# Patient Record
Sex: Female | Born: 1965 | Hispanic: Yes | Marital: Married | Smoking: Former smoker
Health system: Southern US, Community
[De-identification: ages and names within clinical notes are randomized; demographics above are authoritative.]

## PROBLEM LIST (undated history)

## (undated) DIAGNOSIS — J45909 Unspecified asthma, uncomplicated: Secondary | ICD-10-CM

## (undated) DIAGNOSIS — K449 Diaphragmatic hernia without obstruction or gangrene: Secondary | ICD-10-CM

## (undated) DIAGNOSIS — K219 Gastro-esophageal reflux disease without esophagitis: Secondary | ICD-10-CM

## (undated) DIAGNOSIS — N809 Endometriosis, unspecified: Secondary | ICD-10-CM

## (undated) DIAGNOSIS — T7840XA Allergy, unspecified, initial encounter: Secondary | ICD-10-CM

## (undated) DIAGNOSIS — F419 Anxiety disorder, unspecified: Secondary | ICD-10-CM

## (undated) DIAGNOSIS — F32A Depression, unspecified: Secondary | ICD-10-CM

## (undated) DIAGNOSIS — F329 Major depressive disorder, single episode, unspecified: Secondary | ICD-10-CM

## (undated) DIAGNOSIS — M722 Plantar fascial fibromatosis: Secondary | ICD-10-CM

## (undated) HISTORY — PX: BREAST SURGERY: SHX581

## (undated) HISTORY — PX: DILATION AND CURETTAGE OF UTERUS: SHX78

## (undated) HISTORY — PX: FOOT SURGERY: SHX648

## (undated) HISTORY — PX: KNEE SURGERY: SHX244

## (undated) HISTORY — DX: Endometriosis, unspecified: N80.9

## (undated) HISTORY — PX: CHOLECYSTECTOMY: SHX55

## (undated) HISTORY — PX: OOPHORECTOMY: SHX86

## (undated) HISTORY — DX: Diaphragmatic hernia without obstruction or gangrene: K44.9

## (undated) HISTORY — DX: Anxiety disorder, unspecified: F41.9

## (undated) HISTORY — DX: Gastro-esophageal reflux disease without esophagitis: K21.9

## (undated) HISTORY — DX: Unspecified asthma, uncomplicated: J45.909

## (undated) HISTORY — DX: Depression, unspecified: F32.A

## (undated) HISTORY — DX: Allergy, unspecified, initial encounter: T78.40XA

## (undated) HISTORY — DX: Plantar fascial fibromatosis: M72.2

## (undated) HISTORY — DX: Major depressive disorder, single episode, unspecified: F32.9

## (undated) HISTORY — PX: ABDOMINAL HYSTERECTOMY: SHX81

---

## 2002-04-10 HISTORY — PX: BREAST EXCISIONAL BIOPSY: SUR124

## 2008-07-02 ENCOUNTER — Observation Stay: Payer: Self-pay | Admitting: Vascular Surgery

## 2008-10-21 ENCOUNTER — Ambulatory Visit: Payer: Self-pay | Admitting: Orthopedic Surgery

## 2008-10-22 ENCOUNTER — Ambulatory Visit: Payer: Self-pay | Admitting: Orthopedic Surgery

## 2009-03-09 ENCOUNTER — Observation Stay: Payer: Self-pay | Admitting: Internal Medicine

## 2010-03-11 ENCOUNTER — Ambulatory Visit: Payer: Self-pay | Admitting: Family Medicine

## 2011-06-20 ENCOUNTER — Ambulatory Visit: Payer: Self-pay

## 2012-04-10 HISTORY — PX: HERNIA REPAIR: SHX51

## 2012-06-12 ENCOUNTER — Emergency Department: Payer: Self-pay | Admitting: Emergency Medicine

## 2012-07-30 ENCOUNTER — Emergency Department: Payer: Self-pay | Admitting: Emergency Medicine

## 2012-07-30 LAB — CK TOTAL AND CKMB (NOT AT ARMC): CK, Total: 115 U/L (ref 21–215)

## 2012-07-30 LAB — CBC
HCT: 40.9 % (ref 35.0–47.0)
HGB: 13.8 g/dL (ref 12.0–16.0)
MCHC: 33.6 g/dL (ref 32.0–36.0)
MCV: 88 fL (ref 80–100)
RDW: 13.5 % (ref 11.5–14.5)
WBC: 6.3 10*3/uL (ref 3.6–11.0)

## 2012-07-30 LAB — TROPONIN I: Troponin-I: <0.02 ng/mL

## 2012-07-30 LAB — BASIC METABOLIC PANEL
BUN: 14 mg/dL (ref 7–18)
Calcium, Total: 8.3 mg/dL — ABNORMAL LOW (ref 8.5–10.1)
EGFR (Non-African Amer.): >60
Osmolality: 274 (ref 275–301)
Sodium: 137 mmol/L (ref 136–145)

## 2012-07-31 ENCOUNTER — Encounter: Payer: Self-pay | Admitting: *Deleted

## 2012-08-01 ENCOUNTER — Ambulatory Visit (INDEPENDENT_AMBULATORY_CARE_PROVIDER_SITE_OTHER): Payer: BC Managed Care – PPO | Admitting: Cardiovascular Disease

## 2012-08-01 ENCOUNTER — Encounter: Payer: Self-pay | Admitting: Cardiovascular Disease

## 2012-08-01 VITALS — BP 120/80 | HR 80 | Ht 62.0 in | Wt 226.0 lb

## 2012-08-01 DIAGNOSIS — R0602 Shortness of breath: Secondary | ICD-10-CM

## 2012-08-01 DIAGNOSIS — R079 Chest pain, unspecified: Secondary | ICD-10-CM

## 2012-08-01 NOTE — Patient Instructions (Addendum)
Labs today.  Please take orders to [facility] physician is ordering a Stress Myoview to be done at [facility].  We will call you with details.  Your physician has requested that you have an echocardiogram. Echocardiography is a painless test that uses sound waves to create images of your heart. It provides your doctor with information about the size and shape of your heart and how well your heart's chambers and valves are working. This procedure takes approximately one hour. There are no restrictions for this procedure.  Follow up after test.

## 2012-08-02 LAB — HEPATIC FUNCTION PANEL
Albumin: 4.6 g/dL (ref 3.5–5.5)
Bilirubin, Direct: 0.09 mg/dL (ref 0.00–0.40)
Total Bilirubin: 0.3 mg/dL (ref 0.0–1.2)
Total Protein: 7.1 g/dL (ref 6.0–8.5)

## 2012-08-02 LAB — URINALYSIS
Bilirubin, UA: NEGATIVE
Ketones, UA: NEGATIVE
Leukocytes, UA: NEGATIVE
Nitrite, UA: NEGATIVE
RBC, UA: NEGATIVE
Specific Gravity, UA: 1.021 (ref 1.005–1.030)
Urobilinogen, Ur: 0.2 mg/dL (ref 0.0–1.9)
pH, UA: 6 (ref 5.0–7.5)

## 2012-08-02 LAB — LIPID PANEL
Chol/HDL Ratio: 3.9 ratio units (ref 0.0–4.4)
HDL: 63 mg/dL (ref >39)
LDL Calculated: 159 mg/dL — ABNORMAL HIGH (ref 0–99)
VLDL Cholesterol Cal: 21 mg/dL (ref 5–40)

## 2012-08-04 ENCOUNTER — Encounter: Payer: Self-pay | Admitting: Cardiovascular Disease

## 2012-08-04 DIAGNOSIS — R0602 Shortness of breath: Secondary | ICD-10-CM | POA: Insufficient documentation

## 2012-08-04 DIAGNOSIS — R079 Chest pain, unspecified: Secondary | ICD-10-CM | POA: Insufficient documentation

## 2012-08-04 NOTE — Assessment & Plan Note (Signed)
Her chest pain is overall atypical. However, she has strong family history of premature coronary artery disease. Thus, I recommend further evaluation with a treadmill nuclear stress test.

## 2012-08-04 NOTE — Progress Notes (Signed)
HPI  This is a 47 year old female who was referred from the emergency room at [facility] for evaluation of chest pain and dyspnea. The patient has no previous cardiac history. However, there is strong family history of premature coronary artery disease. She is a former smoker and quit 2 years ago. She smoked for 15 years. She currently does not have a primary care physician and is not aware of any diabetes, hypertension or hyperlipidemia. She has not been feeling well for about a month and reports about 25 weight gain over the last month with increased dyspnea, edema and abdominal distention. This has been associated with palpitations as well. Recently, she started having substernal chest discomfort described as aching and heaviness which worsens when she lies down. It does not get worse with deep breath or cough. She felt that this was similar to a previous chest pain that she had when she was diagnosed with pleurisy. She has been taking ibuprofen as needed with no significant improvement. She went to the emergency room at [facility] on  April 22. Her labs were unremarkable including negative cardiac enzymes. ECG did not show any acute changes. Chest x-ray showed no acute cardiopulmonary disease.  Allergies  Allergen Reactions  . Butrans (Buprenorphine)   . Codeine   . Demerol (Meperidine)   . Fentanyl   . Latex   . Morphine And Related   . Naproxen   . Percocet (Oxycodone-Acetaminophen)   . Tramadol   . Vicodin (Hydrocodone-Acetaminophen)      No current outpatient prescriptions on file prior to visit.   No current facility-administered medications on file prior to visit.     Past Medical History  Diagnosis Date  . Asthma   . GERD (gastroesophageal reflux disease)   . Anxiety and depression   . Hiatal hernia      Past Surgical History  Procedure Laterality Date  . Cholecystectomy    . Abdominal hysterectomy    . Knee surgery       Family History  Problem Relation Age of Onset    . Heart attack Mother   . Heart disease Father      History   Social History  . Marital Status: Married    Spouse Name: N/A    Number of Children: N/A  . Years of Education: N/A   Occupational History  . Not on file.   Social History Main Topics  . Smoking status: Former Smoker -- 0.50 packs/day for 15 years  . Smokeless tobacco: Not on file  . Alcohol Use: Not on file  . Drug Use: Not on file  . Sexually Active: Not on file   Other Topics Concern  . Not on file   Social History Narrative  . No narrative on file     ROS Constitutional: Negative for fever, chills, diaphoresis, activity change, appetite change. HENT: Negative for hearing loss, nosebleeds, congestion, sore throat, facial swelling, drooling, trouble swallowing, neck pain, voice change, sinus pressure and tinnitus.  Eyes: Negative for photophobia, pain, discharge and visual disturbance.  Respiratory: Negative for apnea, cough, and wheezing.  Gastrointestinal: Negative for nausea, vomiting, abdominal pain, diarrhea, constipation, blood in stool and abdominal distention.  Genitourinary: Negative for dysuria, urgency, frequency, hematuria and decreased urine volume.  Musculoskeletal: Negative for myalgias, back pain, joint swelling, arthralgias and gait problem.  Skin: Negative for color change, pallor, rash and wound.  Neurological: Negative for dizziness, tremors, seizures, syncope, speech difficulty, weakness, light-headedness, numbness and headaches.  Psychiatric/Behavioral: Negative for  suicidal ideas, hallucinations, behavioral problems and agitation. The patient is not nervous/anxious.     PHYSICAL EXAM   BP 120/80  Pulse 80  Ht 5\' 2"  (1.575 m)  Wt 226 lb (102.513 kg)  BMI 41.33 kg/m2 Constitutional: She is oriented to person, place, and time. She appears well-developed and well-nourished. No distress.  HENT: No nasal discharge.  Head: Normocephalic and atraumatic.  Eyes: Pupils are equal and  round. Right eye exhibits no discharge. Left eye exhibits no discharge.  Neck: Normal range of motion. Neck supple. No JVD present. No thyromegaly present.  Cardiovascular: Normal rate, regular rhythm, normal heart sounds. Exam reveals no gallop and no friction rub. No murmur heard.  Pulmonary/Chest: Effort normal and breath sounds normal. No stridor. No respiratory distress. She has no wheezes. She has no rales. She exhibits no tenderness.  Abdominal: Soft. Bowel sounds are normal. She exhibits no distension. There is no tenderness. There is no rebound and no guarding.  Musculoskeletal: Normal range of motion. She exhibits no edema and no tenderness.  Neurological: She is alert and oriented to person, place, and time. Coordination normal.  Skin: Skin is warm and dry. No rash noted. She is not diaphoretic. No erythema. No pallor.  Psychiatric: She has a normal mood and affect. Her behavior is normal. Judgment and thought content normal.     [id number]  Rhythm  Low voltage in precordial leads.   ABNORMAL    ASSESSMENT AND PLAN

## 2012-08-04 NOTE — Assessment & Plan Note (Signed)
She has been experiencing progressive exertional dyspnea associated with lower extremity edema, abdominal distention and weight gain. I don't see clear evidence of fluid overload by physical exam. I will check routine labs to evaluate for other possible causes of weight gain and fluid retention. Thus, I will check TSH, liver function tests and also perform urinalysis to check for proteinuria. I will obtain an echocardiogram to evaluate LV systolic function, diastolic function and pulmonary pressure.

## 2012-08-05 ENCOUNTER — Encounter: Payer: Self-pay | Admitting: Cardiovascular Disease

## 2012-08-05 ENCOUNTER — Ambulatory Visit: Payer: Self-pay | Admitting: Cardiovascular Disease

## 2012-08-05 DIAGNOSIS — R079 Chest pain, unspecified: Secondary | ICD-10-CM

## 2012-08-05 NOTE — Progress Notes (Signed)
Pt informed of normal stress test results.

## 2012-08-09 ENCOUNTER — Ambulatory Visit (INDEPENDENT_AMBULATORY_CARE_PROVIDER_SITE_OTHER): Payer: BC Managed Care – PPO

## 2012-08-09 ENCOUNTER — Other Ambulatory Visit: Payer: Self-pay

## 2012-08-09 ENCOUNTER — Other Ambulatory Visit (INDEPENDENT_AMBULATORY_CARE_PROVIDER_SITE_OTHER): Payer: BC Managed Care – PPO

## 2012-08-09 DIAGNOSIS — R0602 Shortness of breath: Secondary | ICD-10-CM

## 2012-08-09 DIAGNOSIS — R7989 Other specified abnormal findings of blood chemistry: Secondary | ICD-10-CM

## 2012-08-09 DIAGNOSIS — R079 Chest pain, unspecified: Secondary | ICD-10-CM

## 2012-08-10 LAB — T4, FREE: Free T4: 1.04 ng/dL (ref 0.82–1.77)

## 2012-08-15 ENCOUNTER — Other Ambulatory Visit: Payer: BC Managed Care – PPO

## 2012-08-15 ENCOUNTER — Ambulatory Visit (INDEPENDENT_AMBULATORY_CARE_PROVIDER_SITE_OTHER): Payer: BC Managed Care – PPO | Admitting: Adult Health

## 2012-08-15 ENCOUNTER — Encounter: Payer: Self-pay | Admitting: Adult Health

## 2012-08-15 VITALS — BP 112/68 | HR 93 | Temp 98.1°F | Resp 14 | Ht 61.5 in | Wt 224.0 lb

## 2012-08-15 DIAGNOSIS — K219 Gastro-esophageal reflux disease without esophagitis: Secondary | ICD-10-CM | POA: Insufficient documentation

## 2012-08-15 DIAGNOSIS — Z0000 Encounter for general adult medical examination without abnormal findings: Secondary | ICD-10-CM | POA: Insufficient documentation

## 2012-08-15 DIAGNOSIS — R899 Unspecified abnormal finding in specimens from other organs, systems and tissues: Secondary | ICD-10-CM

## 2012-08-15 DIAGNOSIS — F988 Other specified behavioral and emotional disorders with onset usually occurring in childhood and adolescence: Secondary | ICD-10-CM | POA: Insufficient documentation

## 2012-08-15 DIAGNOSIS — Z1239 Encounter for other screening for malignant neoplasm of breast: Secondary | ICD-10-CM

## 2012-08-15 MED ORDER — EPINEPHRINE 0.3 MG/0.3ML IJ SOAJ: 0.3000 mg | Freq: Once | INTRAMUSCULAR | Status: DC

## 2012-08-15 MED ORDER — ESOMEPRAZOLE MAGNESIUM 40 MG PO CPDR: 40.0000 mg | DELAYED_RELEASE_CAPSULE | Freq: Every day | ORAL | Status: DC

## 2012-08-15 NOTE — Assessment & Plan Note (Signed)
Currently on Nexium 20 mg daily and not well controlled. Increase dose to 40 mg daily.

## 2012-08-15 NOTE — Assessment & Plan Note (Addendum)
Patient with long history of attention deficit disorder symptoms without treatment or diagnosis. Refer to psychology for testing.

## 2012-08-15 NOTE — Assessment & Plan Note (Signed)
Normal physical exam. She had some abnormal labs during hospitalization. TSH recently elevated. Repeat TSH. Patient reports that she was nonfasting when she had the lipid profile. Repeat lipids while fasting.

## 2012-08-15 NOTE — Progress Notes (Signed)
Subjective:    Patient ID: Adriana Moore, female    DOB: 10-Sep-1965, 47 y.o.   MRN: [id number]  HPI  Patient is a 47 y/o female who presents to clinic to establish care. She has not had a PCP in years. She was recently hospitalized with chest pain and is status post cardiac workup that was negative.   Past Medical History  Diagnosis Date  . Asthma   . GERD (gastroesophageal reflux disease)   . Anxiety and depression   . Hiatal hernia     Past Surgical History  Procedure Laterality Date  . Cholecystectomy    . Abdominal hysterectomy    . Knee surgery       Family History  Problem Relation Age of Onset  . Heart attack Mother   . Cancer Mother 53    Breast Cancer  . Diabetes Mother   . Heart disease Father   . Hyperlipidemia Sister   . Cancer Brother     renal cell cancer  . Heart disease Brother   . Cancer Maternal Grandmother     Breast cancer  . Heart disease Maternal Grandfather     CHF  . Heart disease Paternal Grandfather      History   Social History  . Marital Status: Married    Spouse Name: N/A    Number of Children: N/A  . Years of Education: N/A   Occupational History  . Not on file.   Social History Main Topics  . Smoking status: Former Smoker -- 0.50 packs/day for 15 years  . Smokeless tobacco: Never Used  . Alcohol Use: No     Comment: rare use  . Drug Use: No  . Sexually Active: Yes    Birth Control/ Protection: Surgical     Comment: hysterectomy   Other Topics Concern  . Not on file   Social History Narrative  . No narrative on file    Health Maintenance:  Tdap - 2013  Flu shot - Does not get flu shots  PAP - Total Hysterectomy with left oophorectomy   Mammography - Due - will order  Colonoscopy - N/A  Labs - Recent labs at [facility]  Depression Screen - patient without any s/s of sadness, hopelessness or anhedonia.  Tobacco Use - Remote tobacco quit 2 years ago. Smoked about 1 pack a week.  Dental Exams - 2 years ago  d/t no dental insurance.  Vision Exam - 1 month ago.   Exercise - Aerobic exercise daily.  Diet - Uses tropical oils and olive oil.   Review of Systems  Constitutional: Negative for fever, chills and fatigue.  Eyes: Negative.   Respiratory: Negative for cough, shortness of breath and wheezing.   Cardiovascular: Positive for leg swelling.       Occasional flutter feeling  Gastrointestinal: Positive for nausea. Negative for vomiting, abdominal pain, diarrhea, constipation and blood in stool.  Endocrine: Positive for heat intolerance.  Genitourinary: Negative.   Musculoskeletal:       Knee pain  Skin: Negative.   Allergic/Immunologic: Positive for environmental allergies and food allergies.  Neurological: Positive for headaches. Negative for dizziness, seizures, speech difficulty, light-headedness and numbness.  Hematological: Negative.   Psychiatric/Behavioral: Positive for decreased concentration. Negative for suicidal ideas, behavioral problems, confusion, sleep disturbance and agitation. The patient is not nervous/anxious.     BP 112/68  Pulse 93  Temp(Src) 98.1 F (36.7 C) (Oral)  Resp 14  Ht 5' 1.5" (1.562 m)  Wt 224 lb (101.606  kg)  BMI 41.64 kg/m2  SpO2 98%    Objective:   Physical Exam  Constitutional: She is oriented to person, place, and time.  Overweight female in no apparent distress  HENT:  Head: Normocephalic and atraumatic.  Right Ear: External ear normal.  Left Ear: External ear normal.  Nose: Nose normal.  Mouth/Throat: Oropharynx is clear and moist.  Eyes: Conjunctivae and EOM are normal. Pupils are equal, round, and reactive to light.  Neck: Normal range of motion. No thyromegaly present.  Cardiovascular: Normal rate, regular rhythm, normal heart sounds and intact distal pulses.  Exam reveals no gallop and no friction rub.   No murmur heard. Pulmonary/Chest: Effort normal and breath sounds normal. No respiratory distress. She has no wheezes. She has  no rales. She exhibits no tenderness.  Abdominal: Soft. Bowel sounds are normal. She exhibits no distension and no mass. There is tenderness. There is no rebound and no guarding.  Tenderness over epigastric area when palpated.  Musculoskeletal: Normal range of motion. She exhibits no edema and no tenderness.  Lymphadenopathy:    She has no cervical adenopathy.  Neurological: She is alert and oriented to person, place, and time. She has normal reflexes. No cranial nerve deficit. Coordination normal.  Skin: Skin is warm and dry. No rash noted. No erythema. No pallor.  Psychiatric: She has a normal mood and affect. Her behavior is normal. Judgment and thought content normal.          Assessment & Plan:

## 2012-08-15 NOTE — Assessment & Plan Note (Signed)
Schedule mammography

## 2012-08-15 NOTE — Patient Instructions (Addendum)
  Thank you for choosing [facility] for your healthcare needs.  Please return for your fasting labs.  We will contact you once the results are available.  I am referring you to psychology for evaluation and testing of ADD.  I have ordered Nexium 40 mg daily.  Our office will contact you with appointment for your annual mammography.

## 2012-08-19 ENCOUNTER — Other Ambulatory Visit (INDEPENDENT_AMBULATORY_CARE_PROVIDER_SITE_OTHER): Payer: BC Managed Care – PPO

## 2012-08-19 ENCOUNTER — Telehealth: Payer: Self-pay | Admitting: *Deleted

## 2012-08-19 DIAGNOSIS — R6889 Other general symptoms and signs: Secondary | ICD-10-CM

## 2012-08-19 DIAGNOSIS — R899 Unspecified abnormal finding in specimens from other organs, systems and tissues: Secondary | ICD-10-CM

## 2012-08-19 LAB — LDL CHOLESTEROL, DIRECT: Direct LDL: 108.2 mg/dL

## 2012-08-19 LAB — LIPID PANEL
Cholesterol: 183 mg/dL (ref 0–200)
HDL: 57.8 mg/dL (ref >39.00)

## 2012-08-19 LAB — TSH: TSH: 2.56 u[IU]/mL (ref 0.35–5.50)

## 2012-08-19 NOTE — Telephone Encounter (Signed)
Pt came in for labs work today and asked if she can get this medications filled  Singulair 10 mg tab  ProAir HFA 90 mcg

## 2012-08-20 MED ORDER — ALBUTEROL SULFATE HFA 108 (90 BASE) MCG/ACT IN AERS: 2.0000 | INHALATION_SPRAY | Freq: Four times a day (QID) | RESPIRATORY_TRACT | Status: DC | PRN

## 2012-08-20 MED ORDER — MONTELUKAST SODIUM 10 MG PO TABS: 10.0000 mg | ORAL_TABLET | Freq: Every day | ORAL | Status: DC

## 2012-08-20 NOTE — Telephone Encounter (Signed)
Rx sent to pharmacy by escript  

## 2012-08-20 NOTE — Telephone Encounter (Signed)
Read  Below.

## 2012-08-20 NOTE — Telephone Encounter (Signed)
OK to refill medications: Singulair and ProAir

## 2012-08-22 ENCOUNTER — Telehealth: Payer: Self-pay | Admitting: *Deleted

## 2012-08-22 ENCOUNTER — Other Ambulatory Visit: Payer: Self-pay | Admitting: Adult Health

## 2012-08-22 DIAGNOSIS — K219 Gastro-esophageal reflux disease without esophagitis: Secondary | ICD-10-CM

## 2012-08-22 NOTE — Telephone Encounter (Signed)
Pt asked about a referral for a mammogram, had not been called with an appointment. Advised that she may call [facility], telephone number provided, to schedule her screening mammogram at her convenience. Verbalized understanding.

## 2012-08-26 ENCOUNTER — Ambulatory Visit: Payer: BC Managed Care – PPO | Admitting: Cardiovascular Disease

## 2012-09-23 ENCOUNTER — Ambulatory Visit: Payer: Self-pay | Admitting: Internal Medicine

## 2012-10-24 ENCOUNTER — Encounter: Payer: Self-pay | Admitting: Emergency Medicine

## 2012-10-25 ENCOUNTER — Encounter: Payer: Self-pay | Admitting: Internal Medicine

## 2012-11-04 ENCOUNTER — Other Ambulatory Visit: Payer: Self-pay | Admitting: Bariatrics

## 2012-11-04 DIAGNOSIS — K219 Gastro-esophageal reflux disease without esophagitis: Secondary | ICD-10-CM

## 2012-11-05 ENCOUNTER — Other Ambulatory Visit: Payer: Self-pay | Admitting: Bariatrics

## 2012-11-05 DIAGNOSIS — K449 Diaphragmatic hernia without obstruction or gangrene: Secondary | ICD-10-CM

## 2012-11-06 ENCOUNTER — Ambulatory Visit
Admission: RE | Admit: 2012-11-06 | Discharge: 2012-11-06 | Disposition: A | Discharge status: Home / Self Care | Payer: BC Managed Care – PPO | Source: Ambulatory Visit | Attending: Bariatrics | Admitting: Bariatrics

## 2012-11-06 ENCOUNTER — Other Ambulatory Visit: Payer: Self-pay

## 2012-11-06 DIAGNOSIS — K219 Gastro-esophageal reflux disease without esophagitis: Secondary | ICD-10-CM

## 2012-12-10 ENCOUNTER — Ambulatory Visit: Payer: Self-pay | Admitting: Specialist

## 2012-12-18 ENCOUNTER — Other Ambulatory Visit: Payer: Self-pay | Admitting: *Deleted

## 2012-12-18 ENCOUNTER — Telehealth: Payer: Self-pay | Admitting: Adult Health

## 2012-12-18 NOTE — Telephone Encounter (Signed)
Pt dropped off forms to be filled out for Bariatric center. Placed in inbox up front.

## 2012-12-19 NOTE — Telephone Encounter (Signed)
Spoke with pt, states need form filled out referring to May office visit. Form given to Raquel. Pt states will complete Food choices and exercise.

## 2012-12-19 NOTE — Telephone Encounter (Signed)
Left message for pt to return my call regarding form.

## 2012-12-20 NOTE — Telephone Encounter (Signed)
Thank you for clarifying that to patient. They need to be told that forms take anywhere from 7-14 days. We will notify her when ready

## 2012-12-20 NOTE — Telephone Encounter (Signed)
Pt called, checking on status of form, requesting it to be picked up by Monday. Advised pt it was not yet completed, to please allow a few days for forms to be completed, that it was just dropped off 9/10 and that I would call her when it was ready for pickup.

## 2012-12-23 ENCOUNTER — Telehealth: Payer: Self-pay | Admitting: Adult Health

## 2012-12-23 NOTE — Telephone Encounter (Signed)
Error

## 2012-12-24 ENCOUNTER — Telehealth: Payer: Self-pay | Admitting: *Deleted

## 2012-12-24 NOTE — Telephone Encounter (Signed)
See other telephone note regarding completion of form.

## 2012-12-24 NOTE — Telephone Encounter (Signed)
Message copied by Dema Severin on Tue Dec 24, 2012  8:27 AM ------      Message from: Novella Olive      Created: Mon Dec 23, 2012 10:48 PM      Regarding: Medically Supervised Weight Loss Documentation Progress Note for Bariatrics       I was working on completing the supervised weight loss documentation patient requested. I have only seen patient once (May) to establish care. We did not discuss how many calories she was eating or should be eating and I do not feel comfortable filling in the bottom portion of the form. I cannot state whether she has been compliant or not because I haven't seen her.             I will be glad to see her and we can establish a plan but this was not done in May. Sorry but I cannot fill out the form. If you like speak to me prior to notifying the patient.            Raquel ------

## 2012-12-24 NOTE — Telephone Encounter (Signed)
The visit was to establish care and a lot was discussed pertaining to her hx, family hx etc. She was using tropical oils and I advised her against it since the new guidelines call to avoid. We did not go into details about foods she should eat or calories per day. The form states that this was not addressed.

## 2012-12-24 NOTE — Telephone Encounter (Signed)
I called and spoke with patient to advise her that Raquel couldn't fill out all of the form since it wasn't discussed at her initial visit.  Patient was calm when I was talking with her and asked that Raquel fill out the portions of the form she could.  She would like to pick up form in the morning in order to take with her to the Bariatric clinic tomorrow. I advised patient that if it wasn't enough information for the Bariatric clinic that she would need to set up another appointment to discuss the areas that are blank, she agreed if it wasn't enough information she would come in for another appointment.

## 2012-12-24 NOTE — Telephone Encounter (Signed)
Pt called again and left voicemail, stating she is really bothered that form cannot be completed. States weight loss was discussed at first visit, she was given papers on foods to avoid. States a lot of time was spent on it at May visit. Is upset that all this was not included in her visit note.

## 2012-12-24 NOTE — Telephone Encounter (Signed)
Pt states that calories and foods to avoid were discussed at May appointment. She would like as much of the form completed as possible, wants to pick up tomorrow. Declined an appointment to discuss further.

## 2013-01-08 ENCOUNTER — Ambulatory Visit: Payer: Self-pay | Admitting: Specialist

## 2013-01-16 ENCOUNTER — Ambulatory Visit (INDEPENDENT_AMBULATORY_CARE_PROVIDER_SITE_OTHER): Payer: BC Managed Care – PPO | Admitting: Adult Health

## 2013-01-16 ENCOUNTER — Encounter: Payer: Self-pay | Admitting: Adult Health

## 2013-01-16 VITALS — BP 116/78 | HR 85 | Temp 98.0°F | Resp 12 | Wt 222.5 lb

## 2013-01-16 DIAGNOSIS — E7800 Pure hypercholesterolemia, unspecified: Secondary | ICD-10-CM

## 2013-01-16 DIAGNOSIS — K219 Gastro-esophageal reflux disease without esophagitis: Secondary | ICD-10-CM

## 2013-01-16 NOTE — Patient Instructions (Signed)
  Please return at your earliest convenience for fasting labs.  I will have the form ready for you to pick up when you return.

## 2013-01-16 NOTE — Assessment & Plan Note (Addendum)
Recommended daily caloric intake 1200-1600. Patient is staying on the lower end of 1200 calories daily. Continue with lean protein choices, healthy fats such as olive oil and avocado. Patient reports that she is still using coconut oil. Once again discussed recommendations of the American Heart Association guidelines to avoid tropical oils. Recommendation based on new guidelines. Check lipids. She is exercising daily by walking 30 minutes a day for 7 days a week using handheld awaits. Her goal is to increase her exercise to walking 5 miles per day and adding [employer] training. Total weight loss since last visit is 13 pounds. Followup in one month.

## 2013-01-16 NOTE — Progress Notes (Signed)
Subjective:    Patient ID: Adriana Moore, female    DOB: Aug 24, 1965, 47 y.o.   MRN: [id number]  HPI  Patient is a 47 year old female who presents to clinic for supervised weight loss in anticipation of bariatric surgery in December 2014. She is being followed by Dr. Effie Shy at [facility] [place], [place] location. Patient's beginning weight and BMI are 235 lbs and 43.7 respectively. Today's weight is 222 lbs and BMI 41.37; her blood pressure is 116/78 with heart rate 85. Patient reports compliance with caloric intake and exercise regimen. She is currently consuming 1200 calories daily and using My Fitness Pal which is a smartphone app as a means to help stay on track. She reports exercising by walking briskly for 30 minutes 7 days a week. She reports being somewhat limited in exercise choices secondary to plantar fasciitis. Patient has recently introduced hand-held weights while walking. She presents to clinic today with her husband. I previously saw patient for the first time in May 2014 to establish care within our practice.    Past Medical History  Diagnosis Date  . Asthma   . GERD (gastroesophageal reflux disease)   . Anxiety and depression   . Hiatal hernia      Past Surgical History  Procedure Laterality Date  . Cholecystectomy    . Abdominal hysterectomy    . Knee surgery       Family History  Problem Relation Age of Onset  . Heart attack Mother   . Cancer Mother 14    Breast Cancer  . Diabetes Mother   . Heart disease Father   . Hyperlipidemia Sister   . Cancer Brother     renal cell cancer  . Heart disease Brother   . Cancer Maternal Grandmother     Breast cancer  . Heart disease Maternal Grandfather     CHF  . Heart disease Paternal Grandfather      History   Social History  . Marital Status: Married    Spouse Name: N/A    Number of Children: N/A  . Years of Education: N/A   Occupational History  . Not on file.   Social History  Main Topics  . Smoking status: Former Smoker -- 0.50 packs/day for 15 years  . Smokeless tobacco: Never Used  . Alcohol Use: No     Comment: rare use  . Drug Use: No  . Sexual Activity: Yes    Birth Control/ Protection: Surgical     Comment: hysterectomy   Other Topics Concern  . Not on file   Social History Narrative  . No narrative on file     Review of Systems  Constitutional: Negative.   HENT: Negative.   Respiratory: Negative.   Cardiovascular: Negative.   Gastrointestinal: Negative.        Recently had upper endoscopy with finding of hiatal hernia. Nexium increased to 40 mg bid.  Genitourinary: Negative.   Musculoskeletal: Negative for back pain, gait problem and joint swelling.       Hx of plantar fasciitis  Skin: Negative.   Allergic/Immunologic: Negative.   Neurological: Negative.   Hematological: Negative.   Psychiatric/Behavioral: Negative.        Objective:   Physical Exam  Constitutional: She is oriented to person, place, and time.  Overweight, 47 y/o female in NAD  Cardiovascular: Normal rate, regular rhythm and normal heart sounds.  Exam reveals no gallop and no friction rub.   No murmur heard. Pulmonary/Chest: Effort normal and  breath sounds normal. No respiratory distress. She has no wheezes. She has no rales.  Abdominal: Soft. Bowel sounds are normal.  Musculoskeletal: Normal range of motion.  Neurological: She is alert and oriented to person, place, and time.  Skin: Skin is warm and dry.  Psychiatric: She has a normal mood and affect. Her behavior is normal. Judgment and thought content normal.    BP 116/78  Pulse 85  Temp(Src) 98 F (36.7 C) (Oral)  Resp 12  Wt 222 lb 8 oz (100.925 kg)  BMI 41.37 kg/m2  SpO2 96%       Assessment & Plan:

## 2013-01-16 NOTE — Assessment & Plan Note (Signed)
Recent upper endoscopy by Dr. Alva Garnet with findings of hiatal hernia. Nexium increased to 40 mg twice a day. She is doing well on this dose.

## 2013-01-27 ENCOUNTER — Other Ambulatory Visit: Payer: BC Managed Care – PPO

## 2013-01-27 ENCOUNTER — Ambulatory Visit: Payer: BC Managed Care – PPO | Admitting: Adult Health

## 2013-01-29 ENCOUNTER — Ambulatory Visit: Payer: Self-pay | Admitting: Podiatry

## 2013-02-03 ENCOUNTER — Other Ambulatory Visit: Payer: BC Managed Care – PPO

## 2013-02-14 ENCOUNTER — Other Ambulatory Visit (INDEPENDENT_AMBULATORY_CARE_PROVIDER_SITE_OTHER): Payer: BC Managed Care – PPO

## 2013-02-14 DIAGNOSIS — E7800 Pure hypercholesterolemia, unspecified: Secondary | ICD-10-CM

## 2013-02-14 LAB — LIPID PANEL
Total CHOL/HDL Ratio: 4
VLDL: 9.2 mg/dL (ref 0.0–40.0)

## 2013-02-14 LAB — LDL CHOLESTEROL, DIRECT: Direct LDL: 160.4 mg/dL

## 2013-02-17 ENCOUNTER — Encounter: Payer: Self-pay | Admitting: *Deleted

## 2013-02-20 ENCOUNTER — Encounter: Payer: Self-pay | Admitting: Adult Health

## 2013-02-20 ENCOUNTER — Ambulatory Visit (INDEPENDENT_AMBULATORY_CARE_PROVIDER_SITE_OTHER): Payer: BC Managed Care – PPO | Admitting: Adult Health

## 2013-02-20 NOTE — Assessment & Plan Note (Signed)
Patient has been compliant with diet and exercise. She has introduced additional exercise to try to build a muscle. Concerned about not been able to do too many abdominal exercises secondary to hiatal hernia. Recommended that she introduce planks to her exercise regimen. These exercises engage the entire core thus strengthening her abdominal muscles. She has also met her goal from previous visit of increasing her fluid intake. She has been drinking 3 L of water daily. Patient remains hopeful that the surgery will be in December. She has an appt with the Bariatric Center this week to discuss further. Patient had lipids drawn this week. Show higher total cholesterol 227 and LDL 160.4. Her HDL is very good at 63.30. I suspect that these numbers will improve with her discontinuation of coconut oil. Recheck lipids in 3 months.

## 2013-02-20 NOTE — Progress Notes (Signed)
Pre visit review using our clinic review tool, if applicable. No additional management support is needed unless otherwise documented below in the visit note. 

## 2013-02-20 NOTE — Progress Notes (Signed)
Subjective:    Patient ID: Adriana Moore, female    DOB: 15-Nov-1965, 47 y.o.   MRN: [id number]  HPI   Patient is a 47 year old female who presents to clinic for supervised weight loss in anticipation of bariatric surgery in December 2014. She is being followed by Dr. Effie Shy at [facility], [place] location. Patient's beginning weight and BMI are 235 lbs and 43.7 respectively. Today's weight is 223 lbs and BMI 42.16; her blood pressure is 130/80 with heart rate 87. Patient reports compliance with caloric intake and exercise regimen. She is currently consuming 1200 calories daily and using My Fitness Pal which is a smartphone app as a means to help stay on track. She reports meeting previous visit's goal of introducing UFC type training activity. She also reports approximately 2 hours of exercise activity including walking, stationary bicycle and free weights. She reports doing this 7 times a week. During previous visit, a recommendation was made to avoid tropical goals in preparation of food and cooking. Patient had been using coconut oil. She has discontinued this since last visit.   Past Medical History  Diagnosis Date  . Asthma   . GERD (gastroesophageal reflux disease)   . Anxiety and depression   . Hiatal hernia      Past Surgical History  Procedure Laterality Date  . Cholecystectomy    . Abdominal hysterectomy    . Knee surgery       Family History  Problem Relation Age of Onset  . Heart attack Mother   . Cancer Mother 56    Breast Cancer  . Diabetes Mother   . Heart disease Father   . Hyperlipidemia Sister   . Cancer Brother     renal cell cancer  . Heart disease Brother   . Cancer Maternal Grandmother     Breast cancer  . Heart disease Maternal Grandfather     CHF  . Heart disease Paternal Grandfather      History   Social History  . Marital Status: Married    Spouse Name: N/A    Number of Children: N/A  . Years of Education:  N/A   Occupational History  . Not on file.   Social History Main Topics  . Smoking status: Former Smoker -- 0.50 packs/day for 15 years  . Smokeless tobacco: Never Used  . Alcohol Use: No     Comment: rare use  . Drug Use: No  . Sexual Activity: Yes    Birth Control/ Protection: Surgical     Comment: hysterectomy   Other Topics Concern  . Not on file   Social History Narrative  . No narrative on file     Review of Systems  Constitutional: Negative.   HENT: Negative.   Respiratory: Negative.   Cardiovascular: Negative.   Gastrointestinal: Negative.        Trying to find activity where she could engage abdominal muscles without aggravating hiatal hernia symptoms.  Genitourinary: Negative.   Musculoskeletal: Negative for back pain, gait problem and joint swelling.       Hx of plantar fasciitis  Skin: Negative.   Allergic/Immunologic: Negative.   Neurological: Negative.   Hematological: Negative.   Psychiatric/Behavioral: Negative.         Objective:   Physical Exam  Constitutional: She is oriented to person, place, and time.  Overweight, 47 y/o female in NAD  Cardiovascular: Normal rate, regular rhythm and normal heart sounds.  Exam reveals no gallop and no friction rub.  No murmur heard. Pulmonary/Chest: Effort normal and breath sounds normal. No respiratory distress. She has no wheezes. She has no rales.  Abdominal: Bowel sounds are normal.  Musculoskeletal: Normal range of motion.  Neurological: She is alert and oriented to person, place, and time.  Skin: Skin is warm and dry.  Psychiatric: She has a normal mood and affect. Her behavior is normal. Judgment and thought content normal.    BP 130/80  Pulse 87  Temp(Src) 97.7 F (36.5 C) (Oral)  Ht 5\' 1"  (1.549 m)  Wt 223 lb (101.152 kg)  BMI 42.16 kg/m2  SpO2 99%       Assessment & Plan:

## 2013-02-21 ENCOUNTER — Other Ambulatory Visit: Payer: Self-pay | Admitting: Specialist

## 2013-02-21 LAB — COMPREHENSIVE METABOLIC PANEL
Anion Gap: 5 — ABNORMAL LOW (ref 7–16)
BUN: 17 mg/dL (ref 7–18)
Bilirubin,Total: 0.4 mg/dL (ref 0.2–1.0)
Calcium, Total: 8.4 mg/dL — ABNORMAL LOW (ref 8.5–10.1)
Chloride: 105 mmol/L (ref 98–107)
Co2: 27 mmol/L (ref 21–32)
EGFR (African American): >60
EGFR (Non-African Amer.): >60
Glucose: 96 mg/dL (ref 65–99)
Potassium: 4.2 mmol/L (ref 3.5–5.1)
Total Protein: 7.2 g/dL (ref 6.4–8.2)

## 2013-02-21 LAB — CBC WITH DIFFERENTIAL/PLATELET
Basophil #: 0 10*3/uL (ref 0.0–0.1)
Basophil %: 0.7 %
Eosinophil %: 2.5 %
HCT: 38.7 % (ref 35.0–47.0)
HGB: 13.2 g/dL (ref 12.0–16.0)
MCH: 29.6 pg (ref 26.0–34.0)
MCHC: 34.2 g/dL (ref 32.0–36.0)
MCV: 87 fL (ref 80–100)
Neutrophil #: 3.6 10*3/uL (ref 1.4–6.5)
Platelet: 197 10*3/uL (ref 150–440)
RBC: 4.47 10*6/uL (ref 3.80–5.20)
RDW: 13.6 % (ref 11.5–14.5)
WBC: 6.3 10*3/uL (ref 3.6–11.0)

## 2013-02-21 LAB — IRON AND TIBC
Iron Saturation: 20 %
Iron: 73 ug/dL (ref 50–170)
Unbound Iron-Bind.Cap.: 285 ug/dL

## 2013-02-21 LAB — HEMOGLOBIN A1C: Hemoglobin A1C: 5.5 % (ref 4.2–6.3)

## 2013-02-21 LAB — PROTIME-INR
INR: 0.9
Prothrombin Time: 12.6 secs (ref 11.5–14.7)

## 2013-02-21 LAB — BILIRUBIN, DIRECT: Bilirubin, Direct: 0.1 mg/dL (ref 0.00–0.20)

## 2013-02-21 LAB — PHOSPHORUS: Phosphorus: 3 mg/dL (ref 2.5–4.9)

## 2013-02-21 LAB — FERRITIN: Ferritin (ARMC): 28 ng/mL (ref 8–388)

## 2013-03-10 ENCOUNTER — Encounter: Payer: Self-pay | Admitting: Adult Health

## 2013-03-10 ENCOUNTER — Ambulatory Visit (INDEPENDENT_AMBULATORY_CARE_PROVIDER_SITE_OTHER): Payer: BC Managed Care – PPO | Admitting: Adult Health

## 2013-03-10 NOTE — Progress Notes (Signed)
   Subjective:    Patient ID: Adriana Moore, female    DOB: 13-Mar-1966, 47 y.o.   MRN: [id number]  HPI  Patient is a 47 year old female who presents to clinic for supervised weight loss in anticipation of bariatric surgery in December 2014. This is her 6/6 visit preceding surgery. She is being followed by Dr. Effie Shy at [facility], [place] location. Patient's beginning weight and BMI are 235 lbs and 43.7 respectively. Today's weight is 217 lbs and BMI 41.0; her blood pressure is 134/80 with heart rate 86. Patient reports compliance with caloric intake and exercise regimen. She is currently consuming 1200 calories daily and using My Fitness Pal which is a smartphone app as a means to help stay on track. She has started the Pre-Surgery Liver Reduction diet which was provided by the [facility]. Adriana Moore reported during last visit having trouble with abdominal exercises 2/2 a hiatal hernia. We dicussed that exercises that increase abdominal pressure would aggravate her hiatal hernia. I recommended she introduce Planks for core and abdominal strengthening. She reports planks for 3 min intervals. She continues with UFC type training 2 times/week and exercises ~ 2 hours daily including walking, stationary bicycle and free weights. Today she is feeling slightly under the weather with upper respiratory symptoms which she is taking OTC medications for. Denies fever, chills.  Current Outpatient Prescriptions on File Prior to Visit  Medication Sig Dispense Refill  . albuterol (PROVENTIL HFA;VENTOLIN HFA) 108 (90 BASE) MCG/ACT inhaler Inhale 2 puffs into the lungs every 6 (six) hours as needed for wheezing.  1 Inhaler  1  . EPINEPHrine (EPI-PEN) 0.3 mg/0.3 mL DEVI Inject 0.3 mLs (0.3 mg total) into the muscle once.  1 Device  2  . esomeprazole (NEXIUM) 40 MG capsule Take 40 mg by mouth 2 (two) times daily.      . montelukast (SINGULAIR) 10 MG tablet Take 10 mg by mouth daily as  needed.       No current facility-administered medications on file prior to visit.    Review of Systems  Constitutional: Negative for fever and chills.  HENT: Positive for postnasal drip and rhinorrhea. Negative for sinus pressure and sore throat.   Respiratory: Positive for cough.   Cardiovascular: Negative.   Gastrointestinal: Negative.   Genitourinary: Negative.   Musculoskeletal: Negative.   Neurological: Negative.   Psychiatric/Behavioral: Negative.        Objective:   Physical Exam  Constitutional: She is oriented to person, place, and time.  Overweight, pleasant female in NAD  Cardiovascular: Normal rate, regular rhythm, normal heart sounds and intact distal pulses.  Exam reveals no gallop and no friction rub.   No murmur heard. Pulmonary/Chest: Effort normal and breath sounds normal. No respiratory distress. She has no wheezes. She has no rales.  Abdominal: Soft. Bowel sounds are normal.  Musculoskeletal: Normal range of motion.  Neurological: She is alert and oriented to person, place, and time.  Skin: Skin is warm and dry.  Psychiatric: She has a normal mood and affect. Her behavior is normal. Judgment and thought content normal.    BP 134/80  Pulse 80  Temp(Src) 97.6 F (36.4 C) (Oral)  Ht 5\' 1"  (1.549 m)  Wt 217 lb (98.431 kg)  BMI 41.02 kg/m2  SpO2 97%      Assessment & Plan:

## 2013-03-10 NOTE — Progress Notes (Signed)
Pre visit review using our clinic review tool, if applicable. No additional management support is needed unless otherwise documented below in the visit note. 

## 2013-03-10 NOTE — Assessment & Plan Note (Addendum)
Patient has lost 18 lbs since beginning supervised weight loss. She is motivated and is working hard towards her goals of having bariatric surgery. Continue to advocate that patient is a good candidate for the surgery and this would help prevent further disease such as HTN, diabetes. [facility] with [facility] to discuss surgery before the end of the year.

## 2013-03-20 ENCOUNTER — Ambulatory Visit: Payer: BC Managed Care – PPO | Admitting: Adult Health

## 2013-07-22 ENCOUNTER — Other Ambulatory Visit: Payer: Self-pay | Admitting: Bariatrics

## 2013-07-22 DIAGNOSIS — R12 Heartburn: Secondary | ICD-10-CM

## 2013-07-22 DIAGNOSIS — K449 Diaphragmatic hernia without obstruction or gangrene: Secondary | ICD-10-CM

## 2013-07-22 DIAGNOSIS — R1013 Epigastric pain: Secondary | ICD-10-CM

## 2013-07-24 ENCOUNTER — Ambulatory Visit
Admission: RE | Admit: 2013-07-24 | Discharge: 2013-07-24 | Disposition: A | Discharge status: Home / Self Care | Payer: BC Managed Care – PPO | Source: Ambulatory Visit | Attending: Bariatrics | Admitting: Bariatrics

## 2013-07-24 ENCOUNTER — Other Ambulatory Visit: Payer: Self-pay | Admitting: Bariatrics

## 2013-07-24 DIAGNOSIS — R1013 Epigastric pain: Secondary | ICD-10-CM

## 2013-07-24 DIAGNOSIS — K449 Diaphragmatic hernia without obstruction or gangrene: Secondary | ICD-10-CM

## 2013-07-24 DIAGNOSIS — R12 Heartburn: Secondary | ICD-10-CM

## 2013-07-25 ENCOUNTER — Other Ambulatory Visit: Payer: BC Managed Care – PPO

## 2013-12-30 IMAGING — CR DG CHEST 2V
1 series · 2 of 2 positions shown · non-contrast
Comparison: none

REASON FOR EXAM: chest pain
COMMENTS:

[Series 1: w chest pa · 0.14mm/px · 2 of 2 slices shown]
[im 1/2]
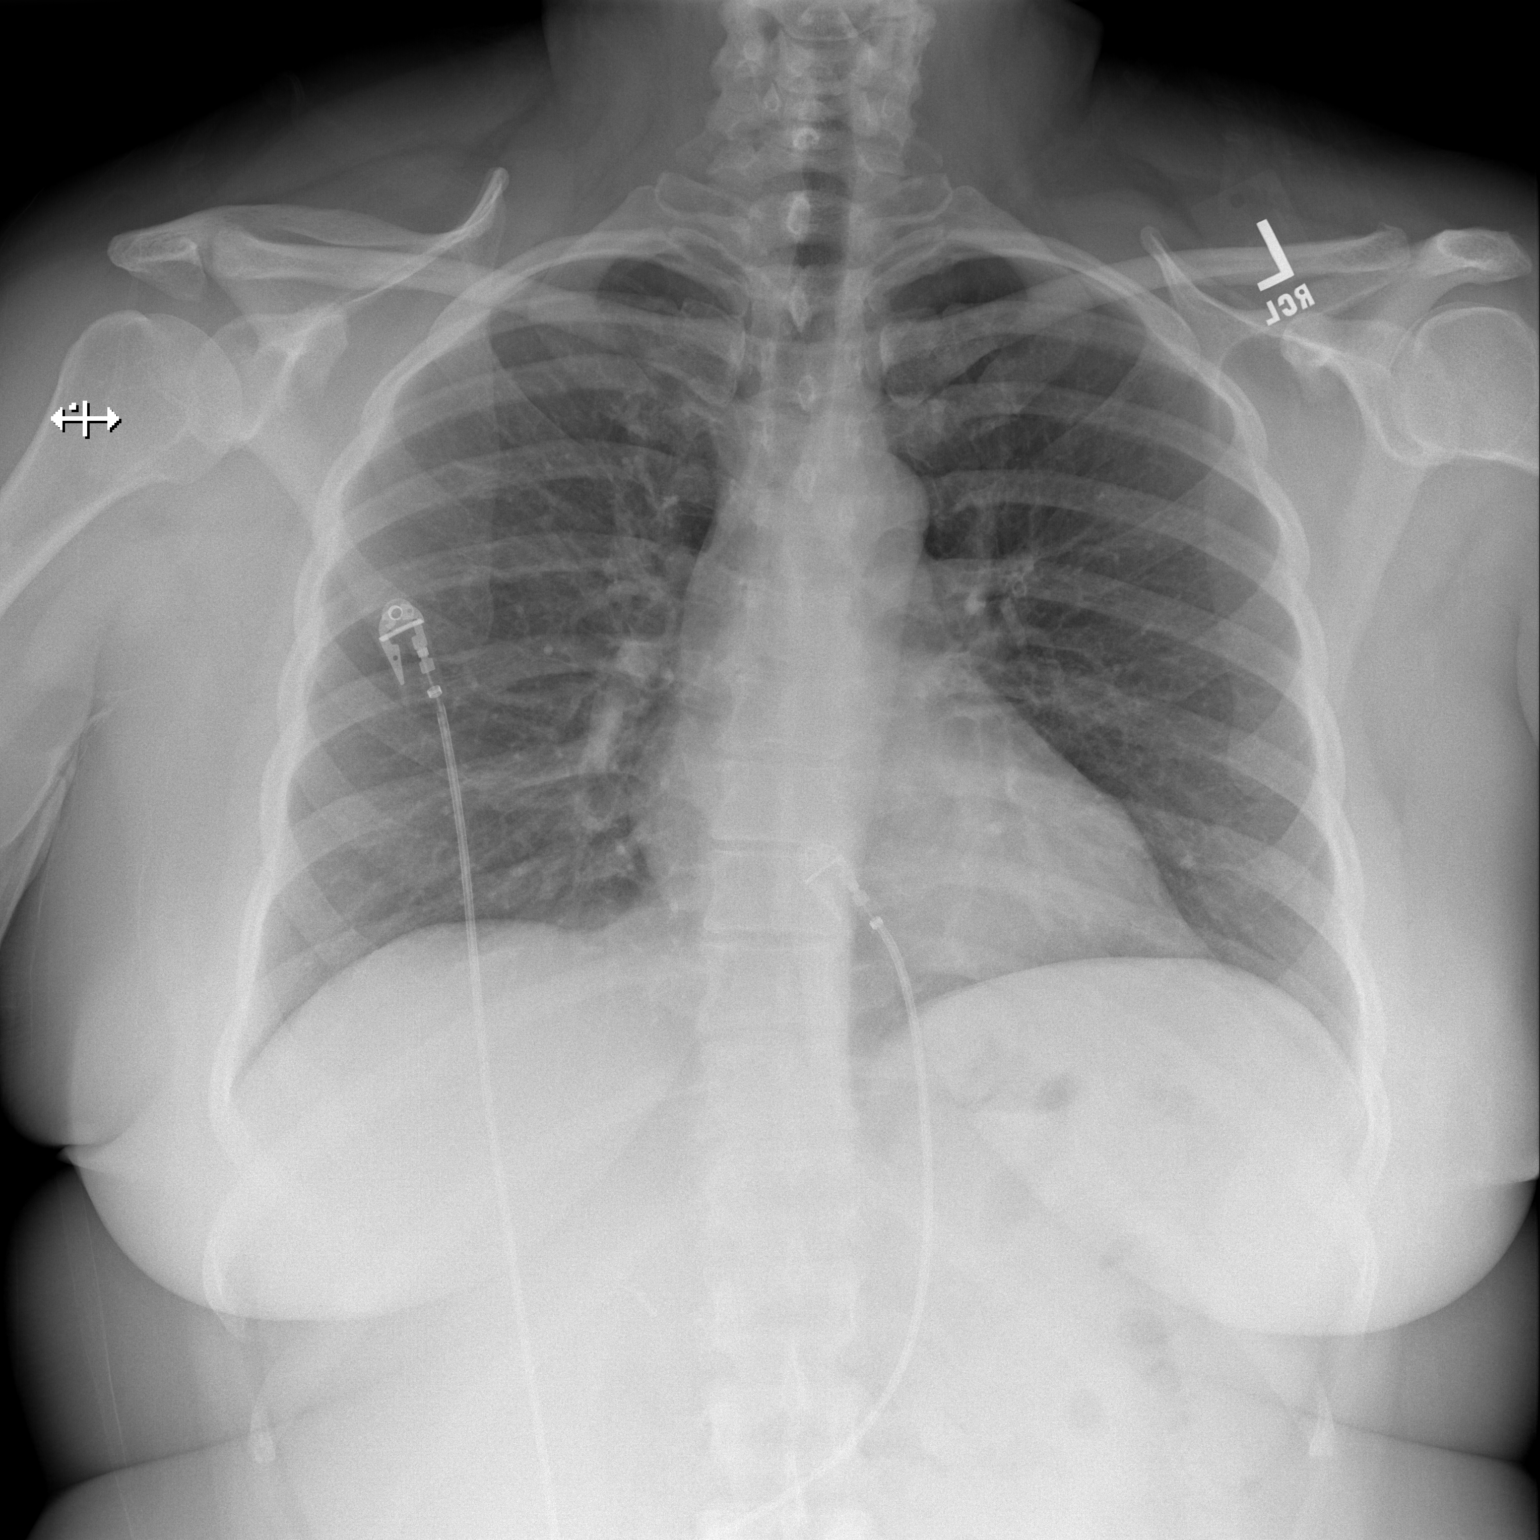
[im 2/2]
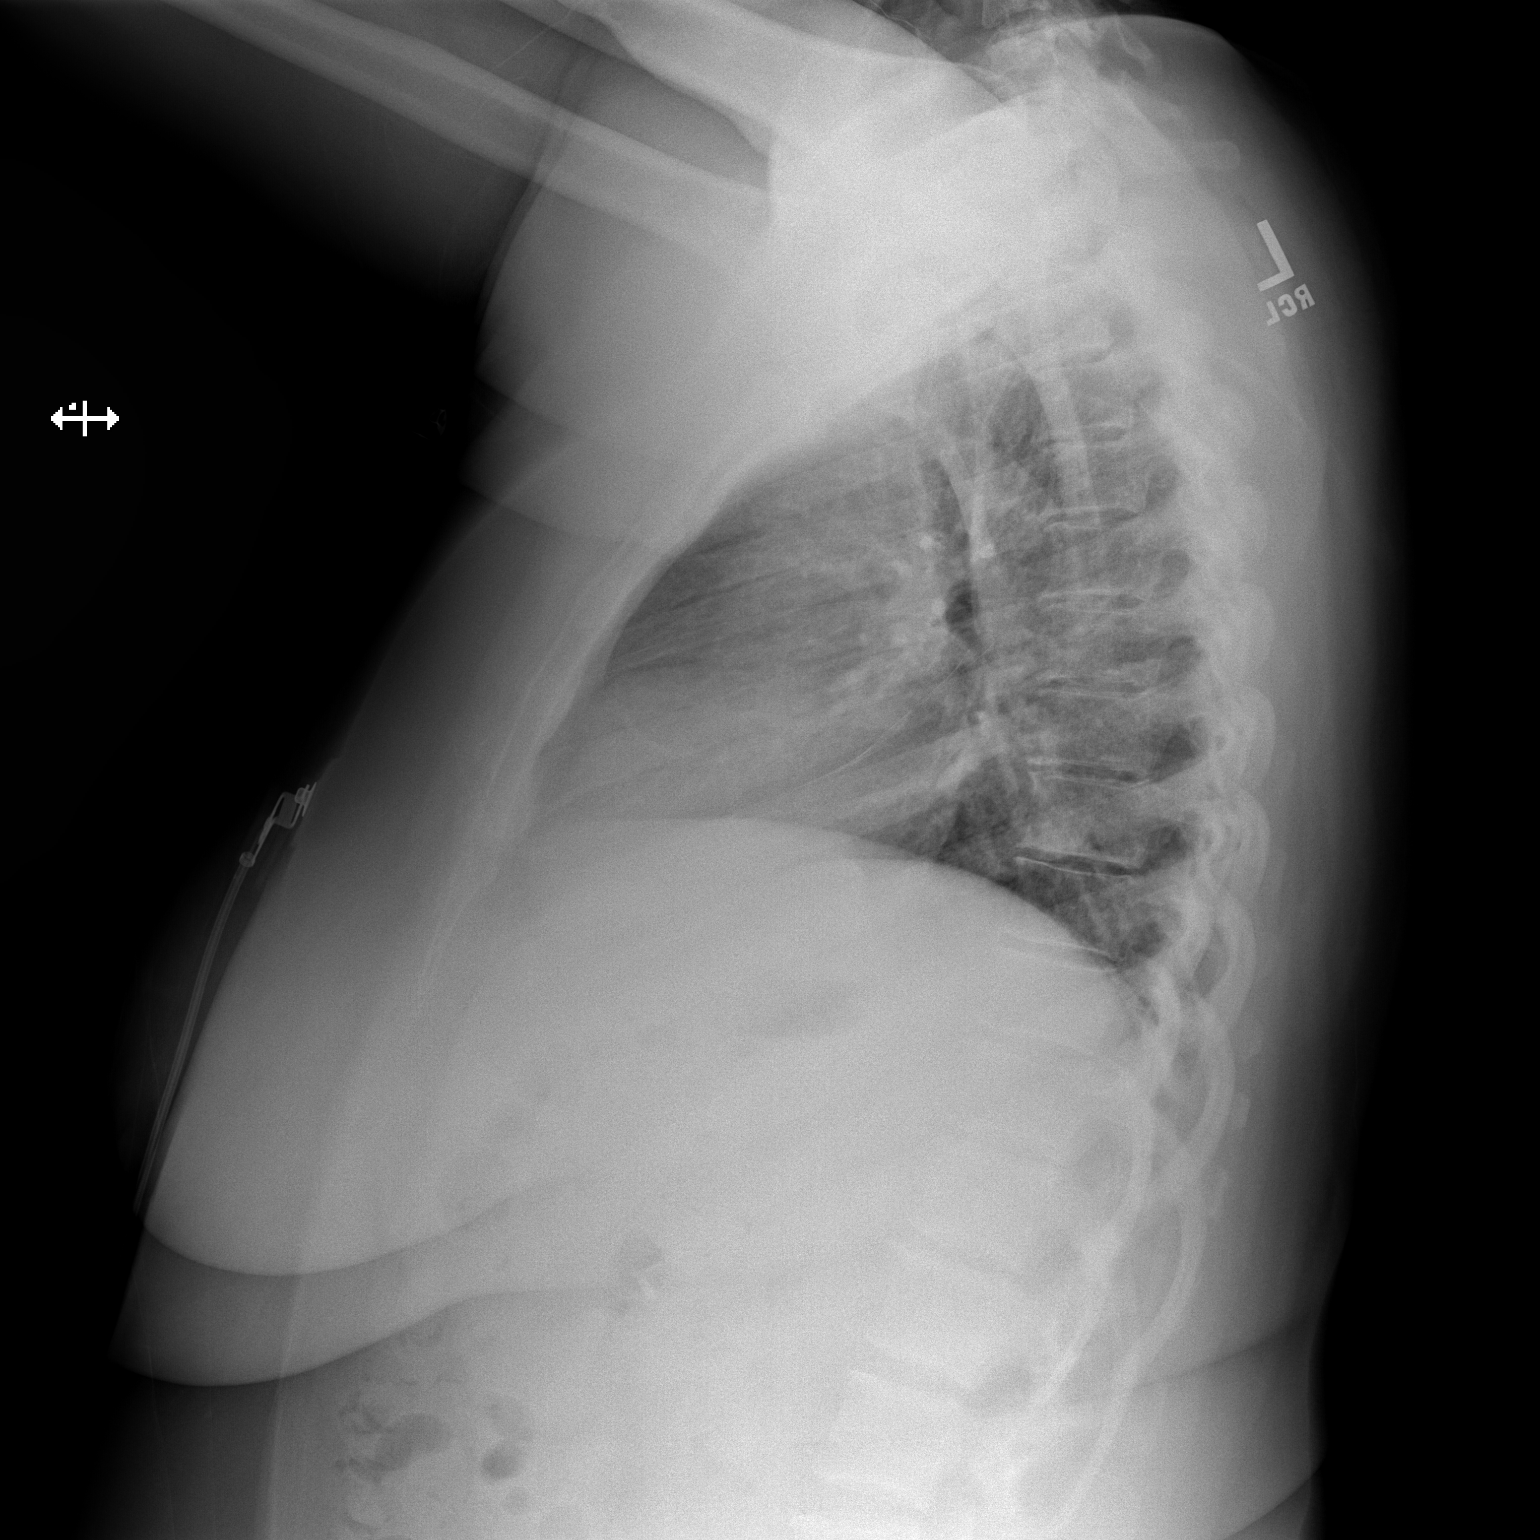

[2 of 2 positions shown; findings below may reference images not displayed]

PROCEDURE:     DXR - DXR CHEST PA (OR AP) AND LATERAL  - July 30, 2012 [DATE]

RESULT:     Cardiac monitoring electrodes are present. There is shallow and
[REDACTED] effort. The lungs are clear. The heart and pulmonary vessels are
normal. The bony and mediastinal structures are unremarkable. There is no
effusion. There is no pneumothorax or evidence of congestive failure.
IMPRESSION: No acute cardiopulmonary disease. stable appearance since
09 March, 2009.

[REDACTED]

## 2015-02-17 ENCOUNTER — Ambulatory Visit (INDEPENDENT_AMBULATORY_CARE_PROVIDER_SITE_OTHER): Payer: BLUE CROSS/BLUE SHIELD

## 2015-02-17 ENCOUNTER — Encounter: Payer: Self-pay | Admitting: Podiatry

## 2015-02-17 ENCOUNTER — Ambulatory Visit (INDEPENDENT_AMBULATORY_CARE_PROVIDER_SITE_OTHER): Payer: BLUE CROSS/BLUE SHIELD | Admitting: Podiatry

## 2015-02-17 VITALS — BP 122/86 | HR 86 | Resp 18

## 2015-02-17 DIAGNOSIS — R52 Pain, unspecified: Secondary | ICD-10-CM

## 2015-02-17 DIAGNOSIS — M722 Plantar fascial fibromatosis: Secondary | ICD-10-CM | POA: Diagnosis not present

## 2015-02-17 MED ORDER — MELOXICAM 15 MG PO TABS: 15.0000 mg | ORAL_TABLET | Freq: Every day | ORAL | Status: DC

## 2015-02-17 MED ORDER — METHYLPREDNISOLONE 4 MG PO TBPK: ORAL_TABLET | ORAL | Status: DC

## 2015-02-18 NOTE — Progress Notes (Signed)
She presents today for follow-up. She states that both of her heels have started hurting over the past few months states that her orthotics no longer seem to be helping.  Objective: Vital signs are stable she is alert and oriented 3 pulses are palpable. Neurologic sensorium is intact and muscle strength is normal bilateral. Orthopedic evaluation and x-rays pain on palpation medial calcaneal tubercles bilateral. Cutaneous evaluation and stress no skin breakdown or open wounds.  Assessment: Recurrence of her plantar fasciitis bilateral.  Plan: Discussed etiology pathology conservative versus surgical therapies. I injected the bilateral heels today with Kenalog and local anesthetic wrote a prescription for Medrol Dosepak to be all by meloxicam. She was also scanned for several orthotics and a pair of myofascial braces were dispensed. I will follow up with her once his orthotics come in.

## 2015-02-22 ENCOUNTER — Encounter: Payer: Self-pay | Admitting: Podiatry

## 2015-03-10 ENCOUNTER — Ambulatory Visit (INDEPENDENT_AMBULATORY_CARE_PROVIDER_SITE_OTHER): Payer: BLUE CROSS/BLUE SHIELD | Admitting: Podiatry

## 2015-03-10 ENCOUNTER — Encounter: Payer: Self-pay | Admitting: Podiatry

## 2015-03-10 DIAGNOSIS — M79673 Pain in unspecified foot: Secondary | ICD-10-CM

## 2015-03-10 DIAGNOSIS — M722 Plantar fascial fibromatosis: Secondary | ICD-10-CM | POA: Diagnosis not present

## 2015-03-10 DIAGNOSIS — R252 Cramp and spasm: Secondary | ICD-10-CM | POA: Diagnosis not present

## 2015-03-10 MED ORDER — IBUPROFEN 600 MG PO TABS: 600.0000 mg | ORAL_TABLET | Freq: Three times a day (TID) | ORAL | Status: DC | PRN

## 2015-03-10 MED ORDER — CYCLOBENZAPRINE HCL 5 MG PO TABS: 5.0000 mg | ORAL_TABLET | Freq: Three times a day (TID) | ORAL | Status: DC | PRN

## 2015-03-10 NOTE — Progress Notes (Signed)
She presents today follow-up plantar fasciitis bilateral. She states that her heels are hurting so badly that is causing cramps in her legs. She states that she has nocturnal cramps they keep her awake.  Objective: Vital signs are stable she is alert and oriented 3 no change in physical findings. Pulses are palpable.  Assessment: Chronic intractable plantar fasciitis. Nocturnal Leg cramps.  Plan: Started her on cyclobenzaprine and 600 mg ibuprofen 3 times a day. Follow up with her in the near future to reevaluate her orthotics. Orthotics were dispensed today with both oral home-going instructions for care and usage.

## 2015-03-10 NOTE — Patient Instructions (Signed)

## 2015-03-16 ENCOUNTER — Telehealth: Payer: Self-pay | Admitting: *Deleted

## 2015-03-16 ENCOUNTER — Encounter: Payer: Self-pay | Admitting: Podiatry

## 2015-03-16 ENCOUNTER — Ambulatory Visit (INDEPENDENT_AMBULATORY_CARE_PROVIDER_SITE_OTHER): Payer: BLUE CROSS/BLUE SHIELD | Admitting: Podiatry

## 2015-03-16 VITALS — BP 102/64 | HR 94 | Resp 16

## 2015-03-16 DIAGNOSIS — R252 Cramp and spasm: Secondary | ICD-10-CM | POA: Diagnosis not present

## 2015-03-16 DIAGNOSIS — M722 Plantar fascial fibromatosis: Secondary | ICD-10-CM | POA: Diagnosis not present

## 2015-03-16 NOTE — Patient Instructions (Signed)
Call [facility] at [phone] and schedule your appointments at your convenience   [address], [place], [place] [address]

## 2015-03-16 NOTE — Progress Notes (Signed)
She presents today states that her orthotics really don't seem to be helping her with her heel pain and she is continuing to have pain while using a muscle relaxer in her calves at night time.  Objective: Vital signs are stable she is alert and oriented 3. Pulses are strongly palpable. She is pain on palpation medial calcaneal tubercles bilateral no swelling in her cast and a negative Homans sign.  Assessment: Chronic intractable plantar fasciitis with leg cramps.  Plan: Discussed etiology pathology conservative versus surgical therapies. At this point just prior to surgical intervention we will try physical therapy at [facility] [place]. I will follow-up with her in 1 month.

## 2015-03-16 NOTE — Telephone Encounter (Signed)
Required PT form and pt data faxed.

## 2015-03-29 ENCOUNTER — Ambulatory Visit (INDEPENDENT_AMBULATORY_CARE_PROVIDER_SITE_OTHER): Payer: BLUE CROSS/BLUE SHIELD | Admitting: Family Medicine

## 2015-03-29 ENCOUNTER — Encounter: Payer: Self-pay | Admitting: Family Medicine

## 2015-03-29 VITALS — BP 119/74 | HR 73 | Temp 97.7°F | Resp 16 | Ht 61.0 in | Wt 170.8 lb

## 2015-03-29 DIAGNOSIS — R5382 Chronic fatigue, unspecified: Secondary | ICD-10-CM

## 2015-03-29 DIAGNOSIS — Z9109 Other allergy status, other than to drugs and biological substances: Secondary | ICD-10-CM | POA: Insufficient documentation

## 2015-03-29 DIAGNOSIS — J4520 Mild intermittent asthma, uncomplicated: Secondary | ICD-10-CM | POA: Diagnosis not present

## 2015-03-29 DIAGNOSIS — N898 Other specified noninflammatory disorders of vagina: Secondary | ICD-10-CM | POA: Diagnosis not present

## 2015-03-29 DIAGNOSIS — Z803 Family history of malignant neoplasm of breast: Secondary | ICD-10-CM

## 2015-03-29 DIAGNOSIS — R6882 Decreased libido: Secondary | ICD-10-CM | POA: Insufficient documentation

## 2015-03-29 DIAGNOSIS — Z8639 Personal history of other endocrine, nutritional and metabolic disease: Secondary | ICD-10-CM

## 2015-03-29 DIAGNOSIS — Z823 Family history of stroke: Secondary | ICD-10-CM

## 2015-03-29 DIAGNOSIS — Z91048 Other nonmedicinal substance allergy status: Secondary | ICD-10-CM | POA: Diagnosis not present

## 2015-03-29 MED ORDER — EPINEPHRINE 0.3 MG/0.3ML IJ SOAJ: 0.3000 mg | Freq: Once | INTRAMUSCULAR | Status: AC

## 2015-03-29 MED ORDER — REPLENS VA GEL: 1.0000 "application " | VAGINAL | Status: DC

## 2015-03-29 MED ORDER — EPINEPHRINE 0.3 MG/0.3ML IJ SOAJ: 0.3000 mg | Freq: Once | INTRAMUSCULAR | Status: DC

## 2015-03-29 MED ORDER — ALBUTEROL SULFATE HFA 108 (90 BASE) MCG/ACT IN AERS: 2.0000 | INHALATION_SPRAY | Freq: Four times a day (QID) | RESPIRATORY_TRACT | Status: DC | PRN

## 2015-03-29 NOTE — Assessment & Plan Note (Signed)
Epi-pen renewed for anaphylaxis.

## 2015-03-29 NOTE — Assessment & Plan Note (Signed)
Well controlled. No symptoms. Rare use of rescue inhaler. Rescue inhaler refilled.

## 2015-03-29 NOTE — Progress Notes (Signed)
Subjective:    Patient ID: Adriana Moore, female    DOB: [birth year], 49 y.o.   MRN: [id number]  HPI: Adriana Moore is a 49 y.o. female presenting on 03/29/2015 for Establish Care   HPI  Pt presents to establish care today. Previous care provider was [facility]  It has been  1year since Her last PCP visit. Records from previous provider will be requested and reviewed. Current medical problems include:  Asthma- adult. Rare symptoms. Was previously on singulair. OccAllergies: Severe allergies to cat and pineapple. Carries epi pen daily. Needs renewal for epipen. Podiatry: Plantar fasciitis both feet. Taking motrin and cyclobenzepril. Has bad leg cramps- takes magnesium potassium and calcium. Has tried mustard and pickle- still having cramps.  Low sex drive- was having hot flashes during perimenopause. Recently lost 76 lbs. Feeling irritable. Some stress in her life- dad had stroke. She is primary caregiver. GERD: Controlled with diet and hiatial hernia repair. Dr. Duke Salvia- did surgery  Strong family history of breast cancer- needs mammogram.   Health maintenance:  Last mammogram 2014 Last pap smear: unsure- total hysteretomy in 1999- endometriosis. No cervix.     Past Medical History  Diagnosis Date  . Asthma   . GERD (gastroesophageal reflux disease)   . Anxiety and depression   . Hiatal hernia   . Allergy    Social History   Social History  . Marital Status: Married    Spouse Name: N/A  . Number of Children: N/A  . Years of Education: N/A   Occupational History  . Not on file.   Social History Main Topics  . Smoking status: Former Smoker -- 0.50 packs/day for 15 years  . Smokeless tobacco: Never Used  . Alcohol Use: No     Comment: rare use  . Drug Use: No  . Sexual Activity: Yes    Birth Control/ Protection: Surgical     Comment: hysterectomy   Other Topics Concern  . Not on file   Social History Narrative   Family History  Problem Relation Age of  Onset  . Heart attack Mother   . Cancer Mother 76    Breast Cancer  . Diabetes Mother   . Heart disease Father   . Hyperlipidemia Sister   . Cancer Brother     renal cell cancer  . Heart disease Brother   . Cancer Maternal Grandmother     Breast cancer  . Heart disease Maternal Grandfather     CHF  . Heart disease Paternal Grandfather    Current Outpatient Prescriptions on File Prior to Visit  Medication Sig  . cyclobenzaprine (FLEXERIL) 5 MG tablet Take 1 tablet (5 mg total) by mouth 3 (three) times daily as needed for muscle spasms.  Marland Kitchen ibuprofen (ADVIL,MOTRIN) 600 MG tablet Take 1 tablet (600 mg total) by mouth every 8 (eight) hours as needed.  . meloxicam (MOBIC) 15 MG tablet Take 1 tablet (15 mg total) by mouth daily.   No current facility-administered medications on file prior to visit.    Review of Systems  Constitutional: Positive for fatigue. Negative for fever and chills.  HENT: Negative.   Respiratory: Negative for cough, chest tightness and wheezing.   Cardiovascular: Negative for chest pain and leg swelling.  Gastrointestinal: Negative for nausea, vomiting, abdominal pain, diarrhea and constipation.  Endocrine: Negative.  Negative for cold intolerance, heat intolerance, polydipsia, polyphagia and polyuria.  Genitourinary: Negative for dysuria, vaginal bleeding, vaginal discharge, difficulty urinating, vaginal pain and dyspareunia.  Musculoskeletal: Negative.  Neurological: Negative for dizziness, light-headedness and numbness.  Psychiatric/Behavioral: Negative.  Negative for suicidal ideas and sleep disturbance.   Per HPI unless specifically indicated above     Objective:    BP 119/74 mmHg  Pulse 73  Temp(Src) 97.7 F (36.5 C) (Oral)  Resp 16  Ht _0  (1.549 m)  Wt 170 lb 12.8 oz (77.474 kg)  BMI 32.29 kg/m2  Wt Readings from Last 3 Encounters:  03/29/15 170 lb 12.8 oz (77.474 kg)  03/10/13 217 lb (98.431 kg)  02/20/13 223 lb (101.152 kg)      Physical Exam  Constitutional: She is oriented to person, place, and time. She appears well-developed and well-nourished.  HENT:  Head: Normocephalic and atraumatic.  Neck: Neck supple.  Cardiovascular: Normal rate, regular rhythm and normal heart sounds.  Exam reveals no gallop and no friction rub.   No murmur heard. Pulmonary/Chest: Effort normal and breath sounds normal. She has no wheezes. She exhibits no tenderness.  Abdominal: Soft. Normal appearance and bowel sounds are normal. She exhibits no distension and no mass. There is no tenderness. There is no rebound and no guarding.  Musculoskeletal: Normal range of motion. She exhibits no edema or tenderness.  Lymphadenopathy:    She has no cervical adenopathy.  Neurological: She is alert and oriented to person, place, and time.  Skin: Skin is warm and dry.   Results for orders placed or performed in visit on 02/21/13  Comprehensive metabolic panel  Result Value Ref Range   Glucose 96 65-99 mg/dL   BUN 17 7-18 mg/dL   Creatinine 0.79 0.60-1.30 mg/dL   Sodium 137 136-145 mmol/L   Potassium 4.2 3.5-5.1 mmol/L   Chloride 105 98-107 mmol/L   Co2 27 21-32 mmol/L   Calcium, Total 8.4 (L) 8.5-10.1 mg/dL   SGOT(AST) 17 15-37 Unit/L   SGPT (ALT) 35 12-78 U/L   Alkaline Phosphatase 113 50-136 Unit/L   Albumin 3.9 3.4-5.0 g/dL   Total Protein 7.2 6.4-8.2 g/dL   Bilirubin,Total 0.4 0.2-1.0 mg/dL   Osmolality 275 275-301   Anion Gap 5 (L) 7-16   EGFR (African American) >60    EGFR (Non-African Amer.) >60   Bilirubin, direct  Result Value Ref Range   Bilirubin, Direct 0.1 0.00-0.20 mg/dL  Amylase  Result Value Ref Range   Amylase 43 25-115 Unit/L  Lipase, blood  Result Value Ref Range   Lipase 102 73-393 Unit/L  Magnesium  Result Value Ref Range   Magnesium 1.9 mg/dL  Phosphorus  Result Value Ref Range   Phosphorus 3.0 2.5-4.9 mg/dL  Folate  Result Value Ref Range   Folic Acid 5.9 9.5-621.3 ng/mL  Ferritin  Result Value  Ref Range   Ferritin ([facility]) 28 8-388 ng/mL  CBC with Differential/Platelet  Result Value Ref Range   WBC 6.3 3.6-11.0 x10 3/mm 3   RBC 4.47 3.80-5.20 X10 6/mm 3   HGB 13.2 12.0-16.0 g/dL   HCT 38.7 35.0-47.0 %   MCV 87 80-100 fL   MCH 29.6 26.0-34.0 pg   MCHC 34.2 32.0-36.0 g/dL   RDW 13.6 11.5-14.5 %   Platelet 197 150-440 x10 3/mm 3   Neutrophil % 56.2 %   Lymphocyte % 33.7 %   Monocyte % 6.9 %   Eosinophil % 2.5 %   Basophil % 0.7 %   Neutrophil # 3.6 1.4-6.5 x10 3/mm 3   Lymphocyte # 2.1 1.0-3.6 x10 3/mm 3   Monocyte # 0.4 0.2-0.9 x10 3/mm    Eosinophil # 0.2 0.0-0.7  x10 3/mm 3   Basophil # 0.0 0.0-0.1 x10 3/mm 3  APTT  Result Value Ref Range   Activated PTT 30.3 23.6-35.9 secs  Protime-INR  Result Value Ref Range   Prothrombin Time 12.6 11.5-14.7 secs   INR 0.9   Hemoglobin A1c  Result Value Ref Range   Hemoglobin A1C 5.5 4.2-6.3 %  Iron and TIBC  Result Value Ref Range   Iron 73 50-170 mcg/dL   Iron Bind.Cap.(Total) 358 250-450 mcg/dL   Unbound Iron-Bind.Cap. 285 mcg/dL   Iron Saturation 20 %      Assessment & Plan:   Problem List Items Addressed This Visit      Respiratory   Mild intermittent asthma    Well controlled. No symptoms. Rare use of rescue inhaler. Rescue inhaler refilled.       Relevant Medications   albuterol (PROVENTIL HFA;VENTOLIN HFA) 108 (90 BASE) MCG/ACT inhaler     Other   Multiple environmental allergies - Primary    Epi-pen renewed for anaphylaxis.       Relevant Medications   EPINEPHrine 0.3 mg/0.3 mL IJ SOAJ injection   Other Relevant Orders   Comprehensive Metabolic Panel (CMET)   Family history of malignant neoplasm of breast in relative diagnosed when younger than 48 years of age    63 ordered. Suggest BrCa testing due to family history- mom diagnosed in her 57's and grandmother very young. Pt will check with her insurance carrier to determine coverage.       Relevant Orders   BRCAssure Comprehensive Test   MM  Digital Diagnostic Bilat   H/O: hypothyroidism   Relevant Orders   TSH   Decreased libido    Pt requesting hormone testing for libido. Check estrogen, FSH, LH, testosterone. Discussed risks of hormone therapy with family history of breast cancer.  Encouraged stress relief and mindfulness due to father's illness to see if that helps increase sex drive.  I spent  >30 minutes face to face counseling about hormone levels, menopause, and decreased sex drive.      Relevant Orders   FSH/LH   Estradiol   Testosterone    Other Visit Diagnoses    Vaginal dryness        Replens vaginal moisturizer to help with dryness. Recommend water based vaginal lubricant for sexual activity.     Relevant Medications    Vaginal Lubricant (REPLENS) GEL    Family history of stroke        Relevant Orders    Lipid Profile    Chronic fatigue        Check vitamin D    Relevant Orders    VITAMIN D 25 Hydroxy (Vit-D Deficiency, Fractures)       Meds ordered this encounter  Medications  . DISCONTD: EPINEPHrine 0.3 mg/0.3 mL IJ SOAJ injection    Sig: Inject 0.3 mLs (0.3 mg total) into the muscle once.    Dispense:  1 Device    Refill:  11    Order Specific Question:  Supervising Provider    Answer:  Arlis Porta [phone]  . Vaginal Lubricant (REPLENS) GEL    Sig: Place 1 application vaginally once a week.    Refill:  0    Order Specific Question:  Supervising Provider    Answer:  Arlis Porta [[id number]]  . EPINEPHrine 0.3 mg/0.3 mL IJ SOAJ injection    Sig: Inject 0.3 mLs (0.3 mg total) into the muscle once.    Dispense:  1  Device    Refill:  11    Order Specific Question:  Supervising Provider    Answer:  Arlis Porta [phone]  . albuterol (PROVENTIL HFA;VENTOLIN HFA) 108 (90 BASE) MCG/ACT inhaler    Sig: Inhale 2 puffs into the lungs every 6 (six) hours as needed for wheezing.    Dispense:  1 Inhaler    Refill:  11    Order Specific Question:  Supervising Provider    Answer:   Arlis Porta [phone]      Follow up plan: Return in about 4 weeks (around 04/26/2015).

## 2015-03-29 NOTE — Assessment & Plan Note (Signed)
Pt requesting hormone testing for libido. Check estrogen, FSH, LH, testosterone. Discussed risks of hormone therapy with family history of breast cancer.  Encouraged stress relief and mindfulness due to father's illness to see if that helps increase sex drive.  I spent  >30 minutes face to face counseling about hormone levels, menopause, and decreased sex drive.

## 2015-03-29 NOTE — Patient Instructions (Signed)
We will check your hormone levels to see if that is causing low libido.   Please call your insurance company to see if they cover genetic testing for BrCA genes due to your strong family history of breast in family members prior to age 48.   Try replens gel for vaginal dryness.

## 2015-03-29 NOTE — Assessment & Plan Note (Addendum)
Mammogram ordered. Suggest BrCa testing due to family history- mom diagnosed in her 40's and grandmother very young. Pt will check with her insurance carrier to determine coverage.  

## 2015-03-30 LAB — VITAMIN D 25 HYDROXY (VIT D DEFICIENCY, FRACTURES): Vit D, 25-Hydroxy: 46.4 ng/mL (ref 30.0–100.0)

## 2015-03-30 LAB — TSH: TSH: 3.34 u[IU]/mL (ref 0.450–4.500)

## 2015-04-01 LAB — BRCASSURE COMPREHENSIVE TEST

## 2015-04-02 ENCOUNTER — Ambulatory Visit
Admission: RE | Admit: 2015-04-02 | Discharge: 2015-04-02 | Disposition: A | Discharge status: Home / Self Care | Payer: BLUE CROSS/BLUE SHIELD | Source: Ambulatory Visit | Attending: Family Medicine | Admitting: Family Medicine

## 2015-04-02 ENCOUNTER — Telehealth: Payer: Self-pay | Admitting: Family Medicine

## 2015-04-02 DIAGNOSIS — Z1231 Encounter for screening mammogram for malignant neoplasm of breast: Secondary | ICD-10-CM | POA: Diagnosis not present

## 2015-04-02 DIAGNOSIS — Z803 Family history of malignant neoplasm of breast: Secondary | ICD-10-CM

## 2015-04-02 DIAGNOSIS — R7989 Other specified abnormal findings of blood chemistry: Secondary | ICD-10-CM

## 2015-04-02 DIAGNOSIS — R6882 Decreased libido: Secondary | ICD-10-CM

## 2015-04-02 NOTE — Telephone Encounter (Signed)
LMTCB

## 2015-04-02 NOTE — Telephone Encounter (Signed)
She would need to make an appt. With Amy to discuss this.  She may want to refer her to GYN if patient continues to want this product. I would not recommend it on the limited info that I have.-jh

## 2015-04-02 NOTE — Telephone Encounter (Signed)
Pt stated she wanted a prescription for a compound testosterone cream.  Her call back number is [phone]

## 2015-04-02 NOTE — Telephone Encounter (Signed)
Advised. Amy please call to discuss this with her. [facility]

## 2015-04-02 NOTE — Telephone Encounter (Signed)
Can we call this in? Saw Amy last.JH

## 2015-04-06 NOTE — Telephone Encounter (Signed)
I have called her and LMTCB.  Thanks! AK

## 2015-04-06 NOTE — Telephone Encounter (Signed)
Spoke with patient  Regarding testosterone treatment. There is equivocal evidence regarding its efficacy low libido in women. Also there was conflicting research on whether it has a role in increasing risk for breast cancer. Given her family history, it would be safer for her to discuss this with a GYN. I want to ensure we are not increasing her risk for cancer. She is amenable to seeing [facility] and I will place a referral today.  Thanks! AK

## 2015-04-07 ENCOUNTER — Telehealth: Payer: Self-pay | Admitting: *Deleted

## 2015-04-07 ENCOUNTER — Ambulatory Visit (INDEPENDENT_AMBULATORY_CARE_PROVIDER_SITE_OTHER): Payer: BLUE CROSS/BLUE SHIELD | Admitting: Podiatry

## 2015-04-07 ENCOUNTER — Encounter: Payer: Self-pay | Admitting: Podiatry

## 2015-04-07 VITALS — BP 105/78 | HR 87 | Resp 16

## 2015-04-07 DIAGNOSIS — R252 Cramp and spasm: Secondary | ICD-10-CM | POA: Diagnosis not present

## 2015-04-07 DIAGNOSIS — M722 Plantar fascial fibromatosis: Secondary | ICD-10-CM | POA: Diagnosis not present

## 2015-04-07 NOTE — Progress Notes (Signed)
She presents today for follow-up of her bilateral heels. She states that he can hardly stand anymore starting to affect my daily activities and the cramping amount leg is ridiculous. She states that she is taking 15 mg of Flexeril to alleviate her symptoms.  Objective: Vital signs are stable alert and oriented 3 severe pain on palpation medial calcaneal tubercles left greater than right.  Assessment: Chronic intractable plantar fasciitis left.  Plan: All conservative therapies have failed at this point I believe surgical indication for endoscopic fasciotomy would be next. However due to the chronicity and the severity of the pain in her left lower extremity I'm and requesting an MRI to confirm plantar fasciitis. I will follow up with her once that has returned.

## 2015-04-07 NOTE — Telephone Encounter (Addendum)
Orders to [facility]. [employer] [place] NO PRIOR AUTHORIZATION IS NEEDED FOR MRI [id number], DX M722.2, REFERENCE 04/06/2105, 2:07CT CHERRY B.  04/30/2015 - Dr. Al CorpusHyatt ordered SE OverRead of MRI Left ankle.  I informed pt, her MRI disc was being sent for a more indepth viewing and we would call with results.  Pt asked if her orthotics were in yet.  [facility].  05/05/2015 - MAILED copy of MRI disc left foot to [facility].

## 2015-04-14 ENCOUNTER — Telehealth: Payer: Self-pay | Admitting: Family Medicine

## 2015-04-14 NOTE — Telephone Encounter (Signed)
Regina from [facility] said they contacted pt to schedule appt and pt declined testing and counseling because it was not done before the end of the year.  She has a new deductible and out of pocket with insurance now.

## 2015-04-15 NOTE — Telephone Encounter (Signed)
Message routed to A. Krebs.

## 2015-04-28 ENCOUNTER — Ambulatory Visit (INDEPENDENT_AMBULATORY_CARE_PROVIDER_SITE_OTHER): Payer: BLUE CROSS/BLUE SHIELD | Admitting: Family Medicine

## 2015-04-28 ENCOUNTER — Ambulatory Visit: Payer: BLUE CROSS/BLUE SHIELD | Admitting: Podiatry

## 2015-04-28 VITALS — BP 117/79 | HR 79 | Temp 98.0°F | Resp 16 | Ht 61.0 in | Wt 173.0 lb

## 2015-04-28 DIAGNOSIS — M6208 Separation of muscle (nontraumatic), other site: Secondary | ICD-10-CM | POA: Diagnosis not present

## 2015-04-28 DIAGNOSIS — R5382 Chronic fatigue, unspecified: Secondary | ICD-10-CM

## 2015-04-28 NOTE — Progress Notes (Signed)
Subjective:    Patient ID: Adriana Moore, female    DOB: [birth year], 50 y.o.   MRN: [id number]  HPI: Adriana Moore is a 50 y.o. female presenting on 04/28/2015 for Fatigue   HPI  Pt presents to follow-up on labwork.  We were planning to discuss genetic testing results but results are pending. She had genetic counseling- was not sure the correct test was ordered. Fatigue is doing well.   Pt has issues with abdominal obesity. She has lost 76lbs over the past year through diet and exercise. She is having trouble strengthening her abdominal muscles.    Past Medical History  Diagnosis Date  . Asthma   . GERD (gastroesophageal reflux disease)   . Anxiety and depression   . Hiatal hernia   . Allergy     Current Outpatient Prescriptions on File Prior to Visit  Medication Sig  . albuterol (PROVENTIL HFA;VENTOLIN HFA) 108 (90 BASE) MCG/ACT inhaler Inhale 2 puffs into the lungs every 6 (six) hours as needed for wheezing.  . cyclobenzaprine (FLEXERIL) 5 MG tablet Take 1 tablet (5 mg total) by mouth 3 (three) times daily as needed for muscle spasms.  Marland Kitchen EPINEPHrine 0.3 mg/0.3 mL IJ SOAJ injection Inject 0.3 mLs (0.3 mg total) into the muscle once.  Marland Kitchen ibuprofen (ADVIL,MOTRIN) 600 MG tablet Take 1 tablet (600 mg total) by mouth every 8 (eight) hours as needed.  . meloxicam (MOBIC) 15 MG tablet Take 1 tablet (15 mg total) by mouth daily.  . Vaginal Lubricant (REPLENS) GEL Place 1 application vaginally once a week.   No current facility-administered medications on file prior to visit.    Review of Systems  Constitutional: Positive for fatigue. Negative for fever and chills.  HENT: Negative.   Respiratory: Negative for cough, chest tightness and wheezing.   Cardiovascular: Negative for chest pain and leg swelling.  Gastrointestinal: Negative for nausea, vomiting, abdominal pain, diarrhea and constipation.  Endocrine: Negative.  Negative for cold intolerance, heat intolerance, polydipsia,  polyphagia and polyuria.  Genitourinary: Negative for dysuria and difficulty urinating.  Musculoskeletal: Negative.   Neurological: Negative for dizziness, light-headedness and numbness.  Psychiatric/Behavioral: Negative.    Per HPI unless specifically indicated above     Objective:    BP 117/79 mmHg  Pulse 79  Temp(Src) 98 F (36.7 C) (Oral)  Resp 16  Ht  (1.549 m)  Wt 173 lb (78.472 kg)  BMI 32.70 kg/m2  Wt Readings from Last 3 Encounters:  04/28/15 173 lb (78.472 kg)  03/29/15 170 lb 12.8 oz (77.474 kg)  03/10/13 217 lb (98.431 kg)    Physical Exam  Constitutional: She is oriented to person, place, and time. She appears well-developed and well-nourished.  HENT:  Head: Normocephalic and atraumatic.  Neck: Neck supple.  Cardiovascular: Normal rate, regular rhythm and normal heart sounds.  Exam reveals no gallop and no friction rub.   No murmur heard. Pulmonary/Chest: Effort normal and breath sounds normal. She has no wheezes. She exhibits no tenderness.  Abdominal: Soft. Normal appearance and bowel sounds are normal. She exhibits no distension and no mass. There is no tenderness. There is no rebound and no guarding.  Musculoskeletal: Normal range of motion. She exhibits no edema or tenderness.  2 fingerbreadth diastasis recti   Lymphadenopathy:    She has no cervical adenopathy.  Neurological: She is alert and oriented to person, place, and time.  Skin: Skin is warm and dry.   Results for orders placed or performed in visit on  03/29/15  FSH/LH  Result Value Ref Range   LH 7.6 mIU/mL   FSH 11.8 mIU/mL  Estradiol  Result Value Ref Range   Free Estradiol, Percent 1.8 %   Free Estradiol, Serum 5.0 pg/mL  Testosterone  Result Value Ref Range   Testosterone 5 (L) 8 - 48 ng/dL  BRCAssure Comprehensive Test  Result Value Ref Range   EXTRACTION Comment   Comprehensive Metabolic Panel (CMET)  Result Value Ref Range   Glucose 79 65 - 99 mg/dL   BUN 12 6 - 24 mg/dL    Creatinine, Ser 1.61 0.57 - 1.00 mg/dL   GFR calc non Af Amer 103 >59 mL/min/1.73   GFR calc Af Amer 119 >59 mL/min/1.73   BUN/Creatinine Ratio 17 9 - 23   Sodium 140 134 - 144 mmol/L   Potassium 4.5 3.5 - 5.2 mmol/L   Chloride 100 96 - 106 mmol/L   CO2 27 18 - 29 mmol/L   Calcium 9.2 8.7 - 10.2 mg/dL   Total Protein 6.4 6.0 - 8.5 g/dL   Albumin 4.5 3.5 - 5.5 g/dL   Globulin, Total 1.9 1.5 - 4.5 g/dL   Albumin/Globulin Ratio 2.4 1.1 - 2.5   Bilirubin Total 0.5 0.0 - 1.2 mg/dL   Alkaline Phosphatase 63 39 - 117 IU/L   AST 18 0 - 40 IU/L   ALT 23 0 - 32 IU/L  Lipid Profile  Result Value Ref Range   Cholesterol, Total 201 (H) 100 - 199 mg/dL   Triglycerides 53 0 - 149 mg/dL   HDL 94 >09 mg/dL   VLDL Cholesterol Cal 11 5 - 40 mg/dL   LDL Calculated 96 0 - 99 mg/dL   Chol/HDL Ratio 2.1 0.0 - 4.4 ratio units  VITAMIN D 25 Hydroxy (Vit-D Deficiency, Fractures)  Result Value Ref Range   Vit D, 25-Hydroxy 46.4 30.0 - 100.0 ng/mL  TSH  Result Value Ref Range   TSH 3.340 0.450 - 4.500 uIU/mL      Assessment & Plan:   Problem List Items Addressed This Visit    None    Visit Diagnoses    Chronic fatigue    -  Primary    Pt will start Vitamin B12 and D to help with fatigue symptoms.     Diastasis recti        Encouraged tummy safe exercises and transverse abdominis strengthening to help with core strengthening.        No orders of the defined types were placed in this encounter.      Follow up plan: Return if symptoms worsen or fail to improve.

## 2015-04-28 NOTE — Patient Instructions (Signed)
Try a vitamin D3- 2000IU daily to help with fatigue.  You can also take a sublingual. B12 to help with fatigue.

## 2015-04-29 ENCOUNTER — Ambulatory Visit
Admission: RE | Admit: 2015-04-29 | Discharge: 2015-04-29 | Disposition: A | Discharge status: Home / Self Care | Payer: BLUE CROSS/BLUE SHIELD | Source: Ambulatory Visit | Attending: Podiatry | Admitting: Podiatry

## 2015-04-29 DIAGNOSIS — M722 Plantar fascial fibromatosis: Secondary | ICD-10-CM | POA: Insufficient documentation

## 2015-04-29 DIAGNOSIS — M25572 Pain in left ankle and joints of left foot: Secondary | ICD-10-CM | POA: Diagnosis present

## 2015-04-29 DIAGNOSIS — M25475 Effusion, left foot: Secondary | ICD-10-CM | POA: Insufficient documentation

## 2015-04-30 NOTE — Telephone Encounter (Signed)
-----   Message from Adriana Moore, [place] sent at 04/29/2015  4:53 PM EST ----- Please send for an over read and inform patient of the delay please.  Thank you.

## 2015-04-30 NOTE — Telephone Encounter (Signed)
Adriana Moore can you check on these.

## 2015-05-12 ENCOUNTER — Encounter: Payer: Self-pay | Admitting: Podiatry

## 2015-05-12 ENCOUNTER — Ambulatory Visit (INDEPENDENT_AMBULATORY_CARE_PROVIDER_SITE_OTHER): Payer: BLUE CROSS/BLUE SHIELD | Admitting: Podiatry

## 2015-05-12 ENCOUNTER — Telehealth: Payer: Self-pay | Admitting: *Deleted

## 2015-05-12 VITALS — BP 123/75 | HR 90 | Resp 18

## 2015-05-12 DIAGNOSIS — M722 Plantar fascial fibromatosis: Secondary | ICD-10-CM

## 2015-05-12 DIAGNOSIS — R252 Cramp and spasm: Secondary | ICD-10-CM

## 2015-05-12 DIAGNOSIS — M7672 Peroneal tendinitis, left leg: Secondary | ICD-10-CM

## 2015-05-12 MED ORDER — CYCLOBENZAPRINE HCL 5 MG PO TABS: 5.0000 mg | ORAL_TABLET | Freq: Three times a day (TID) | ORAL | Status: DC | PRN

## 2015-05-12 NOTE — Progress Notes (Signed)
She presents today for follow-up of her pain to her left foot. Also for an MRI result.  Objective: Vital signs are stable she is alert and oriented 3. She has pain on palpation medial calcaneal tubercle of the left heel. As well as pain on palpation of the peroneal tendons and abduction against resistance consistent with findings of the MRI indicating a tear of the peroneal brevis tendon as well as chronic proximal plantar fasciitis.  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 Peroneal tendon tear per MRI.  Plan: We discussed the etiology pathology conservative versus surgical therapies at this point we consented her for an endoscopic plantar fasciotomy of the left foot and a repair of the peroneal tendon left foot and ankle. I answered all of questions regarding these procedures to the best of my ability in layman's terms she understood this was amenable to an sign on 3 pages of the consent form. She understands that we may end up having to apply a below-knee cast she understands and is amenable to it if necessary. We did discuss the possible postop complications which may include but are not limited to postop pain bleeding swelling infection recurrence need for further surgery overcorrection or under correction. She sign of the patient's consent form and I will follow-up with her in the near future.

## 2015-05-12 NOTE — Patient Instructions (Signed)
Pre-Operative Instructions  Congratulations, you have decided to take an important step to improving your quality of life.  You can be assured that the doctors of [facility] will be with you every step of the way.  1. Plan to be at the surgery center/hospital at least 1 (one) hour prior to your scheduled time unless otherwise directed by the surgical center/hospital staff.  You must have a responsible adult accompany you, remain during the surgery and drive you home.  Make sure you have directions to the surgical center/hospital and know how to get there on time. 2. For hospital based surgery you will need to obtain a history and physical form from your family physician within 1 month prior to the date of surgery- we will give you a form for you primary physician.  3. We make every effort to accommodate the date you request for surgery.  There are however, times where surgery dates or times have to be moved.  We will contact you as soon as possible if a change in schedule is required.   4. No Aspirin/Ibuprofen for one week before surgery.  If you are on aspirin, any non-steroidal anti-inflammatory medications (Mobic, Aleve, Ibuprofen) you should stop taking it 7 days prior to your surgery.  You make take Tylenol  For pain prior to surgery.  5. Medications- If you are taking daily heart and blood pressure medications, seizure, reflux, allergy, asthma, anxiety, pain or diabetes medications, make sure the surgery center/hospital is aware before the day of surgery so they may notify you which medications to take or avoid the day of surgery. 6. No food or drink after midnight the night before surgery unless directed otherwise by surgical center/hospital staff. 7. No alcoholic beverages 24 hours prior to surgery.  No smoking 24 hours prior to or 24 hours after surgery. 8. Wear loose pants or shorts- loose enough to fit over bandages, boots, and casts. 9. No slip on shoes, sneakers are best. 10. Bring  your boot with you to the surgery center/hospital.  Also bring crutches or a walker if your physician has prescribed it for you.  If you do not have this equipment, it will be provided for you after surgery. 11. If you have not been contracted by the surgery center/hospital by the day before your surgery, call to confirm the date and time of your surgery. 12. Leave-time from work may vary depending on the type of surgery you have.  Appropriate arrangements should be made prior to surgery with your employer. 13. Prescriptions will be provided immediately following surgery by your doctor.  Have these filled as soon as possible after surgery and take the medication as directed. 14. Remove nail polish on the operative foot. 15. Wash the night before surgery.  The night before surgery wash the foot and leg well with the antibacterial soap provided and water paying special attention to beneath the toenails and in between the toes.  Rinse thoroughly with water and dry well with a towel.  Perform this wash unless told not to do so by your physician.  Enclosed: 1 Ice pack (please put in freezer the night before surgery)   1 Hibiclens skin cleaner   Pre-op Instructions  If you have any questions regarding the instructions, do not hesitate to call our office.  [place]: [address] [place], [place] [address] [phone]  [place]: [address] [place], [place] [address] [phone]  [place]: [address]  [place], [place] [address] [phone]  Dr. Richard   Tuchman DPM, Dr. Norman Regal DPM Dr. Richard Sikora DPM, Dr. M. Todd Hyatt DPM, Dr. Kathryn Egerton DPM 

## 2015-05-12 NOTE — Telephone Encounter (Signed)
"  I just saw Dr. Al Corpus.  They told me to go ahead and fill out information on the web portal.  I was trying to do that and it will not let me without a date.  He's going to be doing an Endoscopic Plantar Fasciotomy and I have a tendon tear that he's going to repair."  His next available date to do surgery is 06/18/2015.  "My husband gave me two dates not to do it on and they are February 10 and March 10."  We can put you down for March 24th.  "Okay, that date will be fine.  So I can go ahead and submit this date?"  That is fine, I should get your paperwork tomorrow and will get it to surgical center as soon as possible.

## 2015-05-13 ENCOUNTER — Ambulatory Visit (INDEPENDENT_AMBULATORY_CARE_PROVIDER_SITE_OTHER): Payer: BLUE CROSS/BLUE SHIELD

## 2015-05-13 ENCOUNTER — Encounter: Payer: Self-pay | Admitting: Obstetrics and Gynecology

## 2015-05-13 ENCOUNTER — Ambulatory Visit (INDEPENDENT_AMBULATORY_CARE_PROVIDER_SITE_OTHER): Payer: BLUE CROSS/BLUE SHIELD | Admitting: Obstetrics and Gynecology

## 2015-05-13 VITALS — BP 115/76 | HR 87 | Ht 61.0 in | Wt 171.6 lb

## 2015-05-13 VITALS — BP 111/63 | HR 78 | Wt 172.0 lb

## 2015-05-13 DIAGNOSIS — Z803 Family history of malignant neoplasm of breast: Secondary | ICD-10-CM

## 2015-05-13 DIAGNOSIS — R6882 Decreased libido: Secondary | ICD-10-CM

## 2015-05-13 MED ORDER — TESTOSTERONE CYPIONATE 200 MG/ML IM SOLN
200.0000 mg | Freq: Once | INTRAMUSCULAR | Status: AC
  Administered 2015-05-13: 200 mg via INTRAMUSCULAR

## 2015-05-13 MED ORDER — TESTOSTERONE CYPIONATE 200 MG/ML IM SOLN: 100.0000 mg | INTRAMUSCULAR | Status: DC

## 2015-05-13 NOTE — Progress Notes (Signed)
Patient ID: Adriana Moore, female   DOB: 1965/08/28, 50 y.o.   MRN: [id number] Pt present for testosterone injection for decreased libido. Information printed for pt on testosterone.

## 2015-05-13 NOTE — Patient Instructions (Addendum)
TTestosterone Testosterone is a hormone made by the female's testicles and by the adrenal glands, which are a pair of glands on top of the kidneys. Starting at puberty, testosterone stimulates the development of secondary sex characteristics. This includes a deeper voice, growth of muscles and body hair, and penis enlargement.  Females also produce testosterone in both the adrenal glands and ovaries. A female's body converts testosterone into estradiol, the main female sex hormone. An abnormal level of testosterone can cause health issues in both males and females. You may have this test if your health care provider suspects that an abnormal testosterone level is causing or contributing to other health problems. In males, symptoms of an abnormal testosterone level include:  Infertility.  Erectile dysfunction.  Delayed puberty or premature puberty. In females, symptoms of an abnormally high testosterone level include:  Infertility.  Polycystic ovarian syndrome (PCOS).  Developing masculine features (virilization). This test requires a blood sample taken from a vein in your arm or hand. The sample for this test is usually collected in the morning. The amount of testosterone in your blood is highest at that time. RESULTS It is your responsibility to obtain your test results. Ask the lab or department performing the test when and how you will get your results. Contact your health care provider to discuss any questions you have about your results.  The result of a blood test for testosterone will be given as a range of values. A testosterone level that is outside the normal range may indicate a health problem. Testosterone is measured in nanograms per deciliter (ng/dL). Range of Normal Values Ranges for normal values may vary among different labs and hospitals. You should always check with your health care provider after having lab work or other tests done to discuss whether your values are  considered within normal limits. Normal levels of total testosterone are as follows:  Female:  7 months to 49 years old: less than 30 ng/dL.  7-24 years old: less than 300 ng/dL.  25-83 years old: 170-540 ng/dL.  50-12 years old: 250-910 ng/dL.  49 years old and over: 280-1,080 ng/dL.  Female:  7 months to 49 years old: less than 30 ng/dL.  60-58 years old: less than 40 ng/dL.  10-22 years old: less than 60 ng/dL.  36-48 years old: less than 70 ng/dL.  49 years old and over: less than 70 ng/dL. Meaning of Results Outside Normal Value Ranges A testosterone level that is too low or too high can indicate a number of health problems. In males:  A high testosterone level can occur if you:  Have certain types of tumors.  Have an overactive thyroid gland (hyperthyroidism).  Use anabolic steroids.  Are starting puberty early (precocious puberty).  Have an inherited disorder that affects the adrenal glands (congenital adrenal hyperplasia).  A low testosterone level can occur if you:  Have certain genetic diseases.  Have had certain viral infections, such as mumps.  Have pituitary disease.  Have had an injury to the testicles.  Are an alcoholic. In females:  A high testosterone level can occur if you have:  Certain types of tumors.  An inherited disorder that affects certain cells in the adrenal glands (congenital adrenocortical hyperplasia).  PCOS.  A low testosterone level does not cause health problems. Discuss the results of your testosterone test with your health care provider. Your health care provider will use the results of this test and other tests to make a diagnosis.   This information  is not intended to replace advice given to you by your health care provider. Make sure you discuss any questions you have with your health care provider.   Document Released: 04/13/2004 Document Revised: 04/17/2014 Document Reviewed: 07/23/2013 Elsevier Interactive  Patient Education 2016 Elsevier Inc.  Testosterone Testosterone is a hormone made by the female's testicles and by the adrenal glands, which are a pair of glands on top of the kidneys. Starting at puberty, testosterone stimulates the development of secondary sex characteristics. This includes a deeper voice, growth of muscles and body hair, and penis enlargement.  Females also produce testosterone in both the adrenal glands and ovaries. A female's body converts testosterone into estradiol, the main female sex hormone. An abnormal level of testosterone can cause health issues in both males and females. You may have this test if your health care provider suspects that an abnormal testosterone level is causing or contributing to other health problems. In males, symptoms of an abnormal testosterone level include:  Infertility.  Erectile dysfunction.  Delayed puberty or premature puberty. In females, symptoms of an abnormally high testosterone level include:  Infertility.  Polycystic ovarian syndrome (PCOS).  Developing masculine features (virilization). This test requires a blood sample taken from a vein in your arm or hand. The sample for this test is usually collected in the morning. The amount of testosterone in your blood is highest at that time. RESULTS It is your responsibility to obtain your test results. Ask the lab or department performing the test when and how you will get your results. Contact your health care provider to discuss any questions you have about your results.  The result of a blood test for testosterone will be given as a range of values. A testosterone level that is outside the normal range may indicate a health problem. Testosterone is measured in nanograms per deciliter (ng/dL). Range of Normal Values Ranges for normal values may vary among different labs and hospitals. You should always check with your health care provider after having lab work or other tests done to  discuss whether your values are considered within normal limits. Normal levels of total testosterone are as follows:  Female:  7 months to 49 years old: less than 30 ng/dL.  24-95 years old: less than 300 ng/dL.  69-53 years old: 170-540 ng/dL.  29-58 years old: 250-910 ng/dL.  49 years old and over: 280-1,080 ng/dL.  Female:  7 months to 49 years old: less than 30 ng/dL.  82-78 years old: less than 40 ng/dL.  65-70 years old: less than 60 ng/dL.  78-67 years old: less than 70 ng/dL.  49 years old and over: less than 70 ng/dL. Meaning of Results Outside Normal Value Ranges A testosterone level that is too low or too high can indicate a number of health problems. In males:  A high testosterone level can occur if you:  Have certain types of tumors.  Have an overactive thyroid gland (hyperthyroidism).  Use anabolic steroids.  Are starting puberty early (precocious puberty).  Have an inherited disorder that affects the adrenal glands (congenital adrenal hyperplasia).  A low testosterone level can occur if you:  Have certain genetic diseases.  Have had certain viral infections, such as mumps.  Have pituitary disease.  Have had an injury to the testicles.  Are an alcoholic. In females:  A high testosterone level can occur if you have:  Certain types of tumors.  An inherited disorder that affects certain cells in the adrenal glands (congenital adrenocortical  hyperplasia).  PCOS.  A low testosterone level does not cause health problems. Discuss the results of your testosterone test with your health care provider. Your health care provider will use the results of this test and other tests to make a diagnosis.   This information is not intended to replace advice given to you by your health care provider. Make sure you discuss any questions you have with your health care provider.   Document Released: 04/13/2004 Document Revised: 04/17/2014 Document Reviewed:  07/23/2013 Elsevier Interactive Patient Education [employer]

## 2015-05-13 NOTE — Patient Instructions (Addendum)
1.   Free and total testosterone levels are drawn today. 2.  Returned with testosterone cypionate prescription for monthly injection 3 3.  Return in 4 weeks for follow-up

## 2015-05-13 NOTE — Progress Notes (Signed)
GYN ENCOUNTER NOTE  Subjective:       Adriana Moore is a 50 y.o. [id number] female is here for gynecologic evaluation of the following issues:  1. Establishing care/Annual visit: Has not seen a OBGYN in years, especially since hysterectomy.  2. Decreased libido: Patient was seen by PCP in 03/2015, with blood test that came back low for total testosterone. Wanted to see if decreased libido due to low testosterone.  3. Vasomotor symptoms: history of hot flashes and vaginal dryness, improved with diet and lubricant. Serum FSH in 03/2015 not significant for menopause. Patient expressed concerns for HRT if needed d/t significant family history of breast cancer. Labs sent for BRCA gene testing, results currently pending. No history of abnormal mammograms. Patient undergoing plantar fasciitis/ tendon repair in left foot on 07/02/2015.   Gynecologic History No LMP recorded. Patient has had a hysterectomy. Contraception: status post hysterectomy Last Pap: N/A Last mammogram: 03/2015, Results normal  Menarche: 50 yo Cycles were irregular, with menorrhagia, severe pain uncontrolled with NSAIDs, dx with endometriosis S/p hysterectomy with LSO.    Obstetric History OB History  Gravida Para Term Preterm AB SAB TAB Ectopic Multiple Living  '6 3 2 1 3 3    2    ' # Outcome Date GA Lbr Len/2nd Weight Sex Delivery Anes PTL Lv  6 SAB 1992          5 SAB 1990          4 Term 1989   7 lb (3.175 kg) M Vag-Spont   Y  3 SAB 1987          2 Term 1986   6 lb (2.722 kg) F Vag-Spont   Y  1 Preterm 1985     Vag-Spont   FD      Past Medical History  Diagnosis Date  . Asthma   . GERD (gastroesophageal reflux disease)   . Anxiety and depression   . Hiatal hernia   . Allergy   . Endometriosis   . Plantar fasciitis     Past Surgical History  Procedure Laterality Date  . Cholecystectomy    . Knee surgery    . Hernia repair  2014    hital hernia repain   . Abdominal hysterectomy      endometriosis  .  Breast surgery      breast biopsy  . Breast biopsy Right 2004    EXCISIONAL - NEG  . Dilation and curettage of uterus      x 2    Current Outpatient Prescriptions on File Prior to Visit  Medication Sig Dispense Refill  . albuterol (PROVENTIL HFA;VENTOLIN HFA) 108 (90 BASE) MCG/ACT inhaler Inhale 2 puffs into the lungs every 6 (six) hours as needed for wheezing. 1 Inhaler 11  . cyclobenzaprine (FLEXERIL) 5 MG tablet Take 1 tablet (5 mg total) by mouth 3 (three) times daily as needed for muscle spasms. 90 tablet 1  . EPINEPHrine 0.3 mg/0.3 mL IJ SOAJ injection Inject 0.3 mLs (0.3 mg total) into the muscle once. 1 Device 11  . ibuprofen (ADVIL,MOTRIN) 600 MG tablet Take 1 tablet (600 mg total) by mouth every 8 (eight) hours as needed. 90 tablet 1  . meloxicam (MOBIC) 15 MG tablet Take 1 tablet (15 mg total) by mouth daily. 30 tablet 3   No current facility-administered medications on file prior to visit.    Allergies  Allergen Reactions  . Butrans [Buprenorphine]   . Cataflam [Diclofenac] Nausea And Vomiting  .  Codeine   . Demerol [Meperidine]   . Fentanyl   . Latex   . Morphine And Related   . Naproxen   . Percocet [Oxycodone-Acetaminophen]   . Tramadol   . Vicodin [Hydrocodone-Acetaminophen]   . Opana [Oxymorphone Hcl] Nausea And Vomiting and Rash  . Oxycontin [Oxycodone] Nausea And Vomiting and Rash    Social History   Social History  . Marital Status: Married    Spouse Name: N/A  . Number of Children: N/A  . Years of Education: N/A   Occupational History  . Not on file.   Social History Main Topics  . Smoking status: Former Smoker -- 0.50 packs/day for 15 years    Quit date: 04/10/2010  . Smokeless tobacco: Never Used  . Alcohol Use: Yes     Comment: rare use  . Drug Use: No  . Sexual Activity: Yes    Birth Control/ Protection: Surgical     Comment: hysterectomy   Other Topics Concern  . Not on file   Social History Narrative    Family History   Problem Relation Age of Onset  . Heart attack Mother   . Cancer Mother 53    Breast Cancer  . Diabetes Mother   . Breast cancer Mother     62'S  . Heart disease Father   . Hyperlipidemia Sister   . Cancer Brother     renal cell cancer  . Heart disease Brother   . Cancer Maternal Grandmother     Breast cancer  . Heart disease Maternal Grandfather     CHF  . Heart disease Paternal Grandfather     The following portions of the patient's history were reviewed and updated as appropriate: allergies, current medications, past family history, past medical history, past social history, past surgical history and problem list.  Review of Systems Review of Systems - General ROS: negative for - chills, fatigue, fever, hot flashes, malaise or night sweats Hematological and Lymphatic ROS: negative for - bleeding problems or swollen lymph nodes Gastrointestinal ROS: negative for - abdominal pain, blood in stools, change in bowel habits and nausea/vomiting Musculoskeletal ROS: positive for cramps, spasms Genito-Urinary ROS: negative for -  dyspareunia, dysuria, genital discharge, genital ulcers, hematuria, incontinence, nocturia or pelvic pain  Objective:   BP 115/76 mmHg  Pulse 87  Ht '5\' 1"'  (1.549 m)  Wt 171 lb 9.6 oz (77.837 kg)  BMI 32.44 kg/m2  CONSTITUTIONAL: Well-developed, well-nourished female in no acute distress.  HENT:  Normocephalic, atraumatic.  NECK: Normal range of motion, supple, no masses.  Normal thyroid.  SKIN: Skin is warm and dry. No rash noted. Not diaphoretic. No erythema. No pallor. [place]: Alert and oriented to person, place, and time.  PSYCHIATRIC: Normal mood and affect. Normal behavior. Normal judgment and thought content. CARDIOVASCULAR: Normal S1, S2. No m/r/g RESPIRATORY: Clear to auscultation bilaterally BREASTS: Not Examined ABDOMEN: Soft, non distended; Non tender.  No Organomegaly. PELVIC: Not examined  MUSCULOSKELETAL: Not  examined     Assessment:   1. Decreased libido - Testosterone, Free, Total, SHBG  2. Family history of breast cancer - Results for BRCA gene pending  3. Vasomotor symptoms - Currently controlled with diet, FSH level not significant for menopause    Plan:   1. Obtained free and total testosterone levels today; repeat testing just prior to third injection  2. Start testosterone cypionate monthly injection x3. Prescription given, to return for injection. Side effects of medication discussed in detail with patient. 3. Follow  up in 4 weeks management of decreased libido.  A total of 30 minutes were spent face-to-face with the patient during the encounter with greater than 50% dealing with counseling and coordination of care.  Cathlean Sauer, PA-S Brayton Mars, MD    I have seen, interviewed, and examined the patient in conjunction with the [facility]A. student and affirm the diagnosis and management plan. Martin A. DeFrancesco, MD, FACOG   Note: This dictation was prepared with Dragon dictation along with smaller phrase technology. Any transcriptional errors that result from this process are unintentional.

## 2015-05-14 ENCOUNTER — Telehealth: Payer: Self-pay

## 2015-05-14 LAB — TESTOSTERONE, FREE, TOTAL, SHBG
Sex Hormone Binding: 64.8 nmol/L (ref 24.6–122.0)
TESTOSTERONE: 19 ng/dL (ref 8–48)
Testosterone, Free: 3.1 pg/mL (ref 0.0–4.2)

## 2015-05-14 NOTE — Telephone Encounter (Signed)
-----   Message from Herold Harms, MD sent at 05/14/2015 10:05 AM EST ----- Please Notify - Labs normal However, It is still reasonable to do trial of Testosterone as offered.

## 2015-05-14 NOTE — Telephone Encounter (Signed)
Pt aware.

## 2015-06-09 LAB — LIPID PANEL
CHOL/HDL RATIO: 2.1 ratio (ref 0.0–4.4)
Cholesterol, Total: 201 mg/dL — ABNORMAL HIGH (ref 100–199)
HDL: 94 mg/dL (ref >39)
LDL CALC: 96 mg/dL (ref 0–99)
Triglycerides: 53 mg/dL (ref 0–149)
VLDL Cholesterol Cal: 11 mg/dL (ref 5–40)

## 2015-06-09 LAB — TESTOSTERONE: Testosterone: 5 ng/dL — ABNORMAL LOW (ref 8–48)

## 2015-06-09 LAB — ESTRADIOL, FREE
ESTRADIOL: 276 pg/mL
FREE ESTRADIOL, PERCENT: 1.8 %
FREE ESTRADIOL, SERUM: 5 pg/mL

## 2015-06-09 LAB — COMPREHENSIVE METABOLIC PANEL
A/G RATIO: 2.4 (ref 1.1–2.5)
ALBUMIN: 4.5 g/dL (ref 3.5–5.5)
ALT: 23 IU/L (ref 0–32)
AST: 18 IU/L (ref 0–40)
Alkaline Phosphatase: 63 IU/L (ref 39–117)
BUN / CREAT RATIO: 17 (ref 9–23)
BUN: 12 mg/dL (ref 6–24)
Bilirubin Total: 0.5 mg/dL (ref 0.0–1.2)
CALCIUM: 9.2 mg/dL (ref 8.7–10.2)
CO2: 27 mmol/L (ref 18–29)
CREATININE: 0.69 mg/dL (ref 0.57–1.00)
Chloride: 100 mmol/L (ref 96–106)
GFR, EST AFRICAN AMERICAN: 119 mL/min/{1.73_m2} (ref >59)
GFR, EST NON AFRICAN AMERICAN: 103 mL/min/{1.73_m2} (ref >59)
GLOBULIN, TOTAL: 1.9 g/dL (ref 1.5–4.5)
Glucose: 79 mg/dL (ref 65–99)
POTASSIUM: 4.5 mmol/L (ref 3.5–5.2)
SODIUM: 140 mmol/L (ref 134–144)
TOTAL PROTEIN: 6.4 g/dL (ref 6.0–8.5)

## 2015-06-09 LAB — FSH/LH
FSH: 11.8 m[IU]/mL
LH: 7.6 m[IU]/mL

## 2015-06-09 LAB — VISTASEQ HERED. CANCER PANEL
PDF, VISTASEQ: 0
Result Summary: NEGATIVE

## 2015-06-10 ENCOUNTER — Ambulatory Visit (INDEPENDENT_AMBULATORY_CARE_PROVIDER_SITE_OTHER): Payer: BLUE CROSS/BLUE SHIELD | Admitting: Obstetrics and Gynecology

## 2015-06-10 ENCOUNTER — Ambulatory Visit: Payer: BLUE CROSS/BLUE SHIELD | Admitting: Obstetrics and Gynecology

## 2015-06-10 ENCOUNTER — Encounter: Payer: Self-pay | Admitting: Obstetrics and Gynecology

## 2015-06-10 VITALS — BP 99/64 | HR 94 | Wt 171.0 lb

## 2015-06-10 DIAGNOSIS — R319 Hematuria, unspecified: Secondary | ICD-10-CM | POA: Diagnosis not present

## 2015-06-10 DIAGNOSIS — R6882 Decreased libido: Secondary | ICD-10-CM | POA: Diagnosis not present

## 2015-06-10 DIAGNOSIS — N390 Urinary tract infection, site not specified: Secondary | ICD-10-CM | POA: Diagnosis not present

## 2015-06-10 DIAGNOSIS — R1030 Lower abdominal pain, unspecified: Secondary | ICD-10-CM

## 2015-06-10 LAB — POCT URINALYSIS DIPSTICK
Bilirubin, UA: NEGATIVE
Glucose, UA: NEGATIVE
NITRITE UA: NEGATIVE
PH UA: 7
Spec Grav, UA: 1.02
UROBILINOGEN UA: NEGATIVE

## 2015-06-10 MED ORDER — TESTOSTERONE CYPIONATE 200 MG/ML IM SOLN
200.0000 mg | Freq: Once | INTRAMUSCULAR | Status: AC
  Administered 2015-06-10: 200 mg via INTRAMUSCULAR

## 2015-06-10 MED ORDER — NITROFURANTOIN MONOHYD MACRO 100 MG PO CAPS: 100.0000 mg | ORAL_CAPSULE | Freq: Two times a day (BID) | ORAL | Status: DC

## 2015-06-10 NOTE — Progress Notes (Signed)
Chief complaint: 1. Decreased libido 2. UTI symptoms  Patient presents for follow-up. She is now 4 weeks status post first injection testosterone cypionate. DECREASED LIBIDO: Patient has noted marked improvement sex drive, desire, and orgasm quality. UTI SYMPTOMS: Patient has had urinary frequency, pelvic pressure and pelvic cramping. Urinalysis is notable for hematuria and positive white blood cells.  Past medical history, past surgical history, problem list, medications, and allergies are reviewed  OBJECTIVE: BP 99/64 mmHg  Pulse 94  Wt 171 lb (77.565 kg) Physical exam deferred  PLAN: 1. Testosterone cypionate injection #2 today 2. UA with CNS 3. Macrobid twice a day for 7 days 4. Return in 4 weeks for follow-up, 30 injection, and testing of testosterone level-free/total  A total of 15 minutes were spent face-to-face with the patient during this encounter and over half of that time dealt with counseling and coordination of care.  Herold Harms, MD  Note: This dictation was prepared with Dragon dictation along with smaller phrase technology. Any transcriptional errors that result from this process are unintentional.

## 2015-06-10 NOTE — Patient Instructions (Signed)
1. Maintain by mouth hydration 2. Macrodantin twice a day for 7 days (Macrobid) 3. Testosterone injections (#2) 4. Return in 4 weeks

## 2015-06-14 ENCOUNTER — Telehealth: Payer: Self-pay | Admitting: *Deleted

## 2015-06-14 LAB — URINE CULTURE

## 2015-06-14 NOTE — Telephone Encounter (Signed)
I'm calling regarding your surgery scheduled for 07/02/2015 with Dr. Al CorpusHyatt.  Did you have a change in your insurance?  "I did, I [place] a [place]  I see it, sorry for the inconvenience.  They must have entered it after I had printed your information.  "It's no problem."

## 2015-06-15 ENCOUNTER — Telehealth: Payer: Self-pay | Admitting: Obstetrics and Gynecology

## 2015-06-15 MED ORDER — FLUCONAZOLE 150 MG PO TABS: 150.0000 mg | ORAL_TABLET | Freq: Every day | ORAL | Status: DC

## 2015-06-15 NOTE — Telephone Encounter (Signed)
Pt aware. Pt requested diflucan. Erx.

## 2015-06-15 NOTE — Telephone Encounter (Signed)
-----   Message from Herold HarmsMartin A Defrancesco, MD sent at 06/14/2015  7:31 PM EST ----- Please notify - Abnormal Labs Urine culture is positive for Escherichia coli UTI sensitive to Macrobid. Complete course of antibiotic as written

## 2015-06-23 ENCOUNTER — Encounter: Payer: Self-pay | Admitting: Podiatry

## 2015-07-01 ENCOUNTER — Other Ambulatory Visit: Payer: Self-pay | Admitting: Podiatry

## 2015-07-01 MED ORDER — CEPHALEXIN 500 MG PO CAPS: 500.0000 mg | ORAL_CAPSULE | Freq: Three times a day (TID) | ORAL | Status: DC

## 2015-07-01 MED ORDER — HYDROMORPHONE HCL 4 MG PO TABS: 4.0000 mg | ORAL_TABLET | Freq: Four times a day (QID) | ORAL | Status: DC | PRN

## 2015-07-01 MED ORDER — ONDANSETRON HCL 4 MG PO TABS: 4.0000 mg | ORAL_TABLET | Freq: Three times a day (TID) | ORAL | Status: DC | PRN

## 2015-07-02 ENCOUNTER — Encounter: Payer: Self-pay | Admitting: Podiatry

## 2015-07-02 DIAGNOSIS — M722 Plantar fascial fibromatosis: Secondary | ICD-10-CM | POA: Diagnosis not present

## 2015-07-02 DIAGNOSIS — M7672 Peroneal tendinitis, left leg: Secondary | ICD-10-CM | POA: Diagnosis not present

## 2015-07-02 DIAGNOSIS — R252 Cramp and spasm: Secondary | ICD-10-CM | POA: Diagnosis not present

## 2015-07-07 ENCOUNTER — Encounter: Payer: Self-pay | Admitting: Podiatry

## 2015-07-07 ENCOUNTER — Ambulatory Visit (INDEPENDENT_AMBULATORY_CARE_PROVIDER_SITE_OTHER): Payer: BLUE CROSS/BLUE SHIELD | Admitting: Podiatry

## 2015-07-07 VITALS — BP 131/83 | HR 80 | Temp 96.8°F | Resp 16

## 2015-07-07 DIAGNOSIS — M722 Plantar fascial fibromatosis: Secondary | ICD-10-CM

## 2015-07-07 DIAGNOSIS — Z9889 Other specified postprocedural states: Secondary | ICD-10-CM

## 2015-07-07 DIAGNOSIS — M7672 Peroneal tendinitis, left leg: Secondary | ICD-10-CM

## 2015-07-07 NOTE — Progress Notes (Signed)
She presents today in her cast to her left lower extremity asking for us to remove it. She states that the cast is aggravating to her it will not let her do what she needs to do. She denies fever chills nausea vomiting muscle aches and pains. She presents utilizing and the scooter and crutches. She denies chest pain and shortness of breath.  Objective: Vital signs are stable she is alert and oriented 3. She has good sensation to her toes and the cast is not tight around the calf.  Assessment: Well-healing surgical foot casted left lower extremity.  Plan: I encouraged her to wear the cast for 1 more week for me and I would remove it and consider placing it in another cast or a Cam Walker depending on her activity level. She will notify us with any questions or concerns.

## 2015-07-08 ENCOUNTER — Ambulatory Visit: Payer: BLUE CROSS/BLUE SHIELD | Admitting: Obstetrics and Gynecology

## 2015-07-14 ENCOUNTER — Ambulatory Visit (INDEPENDENT_AMBULATORY_CARE_PROVIDER_SITE_OTHER): Payer: BLUE CROSS/BLUE SHIELD | Admitting: Podiatry

## 2015-07-14 DIAGNOSIS — M722 Plantar fascial fibromatosis: Secondary | ICD-10-CM | POA: Diagnosis not present

## 2015-07-14 DIAGNOSIS — Z9889 Other specified postprocedural states: Secondary | ICD-10-CM

## 2015-07-14 DIAGNOSIS — M7672 Peroneal tendinitis, left leg: Secondary | ICD-10-CM | POA: Diagnosis not present

## 2015-07-14 NOTE — Progress Notes (Signed)
She presents today 2 weeks status post EPF left foot and peroneal tendon repair left foot. She denies fever chills nausea vomiting muscle aches and pains. States that she has been standing with the cast as evidenced by dirt on the bottom of the cast and compression. She denies calf pain shortness of breath and chest pain.  Objective: Vital signs stable she is alert and oriented 3. Presents nonweight bearing utilizing a scooter today cast to the left leg. Cast was removed today incision sites. Be healing well also. Were removed. She has tenderness on palpation of the left heel she has tenderness on palpation of the peroneal tendons.  Assessment: Well-healing surgical foot 2 weeks status post peroneal tendon repair left and EPF left.  Plan: We casted her today after dry sterile compressive dressing was applied and I will follow-up with her in 2 weeks for cast removal and boot application.

## 2015-07-15 ENCOUNTER — Telehealth: Payer: Self-pay | Admitting: Podiatry

## 2015-07-15 MED ORDER — ONDANSETRON HCL 4 MG PO TABS: 4.0000 mg | ORAL_TABLET | Freq: Three times a day (TID) | ORAL | Status: DC | PRN

## 2015-07-15 NOTE — Addendum Note (Signed)
Addended by: Alphia Kava'CONNELL, Joelle [place]well D on: 07/15/2015 02:54 PM   Modules accepted: Orders

## 2015-07-15 NOTE — Telephone Encounter (Signed)
Pt called in needing her RX filled ( ondansetron )

## 2015-07-15 NOTE — Telephone Encounter (Signed)
Pt states she get nauseous when taking pain medication, so would like to have Zofran on hand.

## 2015-07-16 ENCOUNTER — Encounter: Payer: Self-pay | Admitting: Sports Medicine

## 2015-07-16 ENCOUNTER — Telehealth: Payer: Self-pay | Admitting: Podiatry

## 2015-07-16 ENCOUNTER — Ambulatory Visit (INDEPENDENT_AMBULATORY_CARE_PROVIDER_SITE_OTHER): Payer: BLUE CROSS/BLUE SHIELD | Admitting: Sports Medicine

## 2015-07-16 DIAGNOSIS — Z4789 Encounter for other orthopedic aftercare: Secondary | ICD-10-CM

## 2015-07-16 DIAGNOSIS — M79672 Pain in left foot: Secondary | ICD-10-CM

## 2015-07-16 DIAGNOSIS — Z9889 Other specified postprocedural states: Secondary | ICD-10-CM

## 2015-07-16 MED ORDER — ONDANSETRON HCL 4 MG PO TABS: 4.0000 mg | ORAL_TABLET | Freq: Three times a day (TID) | ORAL | Status: DC | PRN

## 2015-07-16 NOTE — Telephone Encounter (Addendum)
-----   Message from Warner Mccreedyheryl M Bedington sent at 07/16/2015  9:35 AM EDT ----- Contact: [phone] Pt saw Dr. Al CorpusHyatt on 07/14/2015 and put on another cast.  She says since then it has been very irritated underneath and has a burning sensation.  She has begun taking her pain pills again.  She asked if she could get a boot instead of the cast.  I spoke with pt, pain is a burning up top of foot and leg, has blown into the cast with cool hair drier without relief.  Pt states Dr. Al CorpusHyatt had offered to put her into a boot, instead of the cast, and she wishes she had chosen the boot.  I told pt I would write Dr. Al CorpusHyatt and call again with response.

## 2015-07-16 NOTE — Progress Notes (Signed)
Patient ID: Shamekia Tippets, female   DOB: 09-06-1965, 50 y.o.   MRN: [id number] Subjective: Jatavia Keltner is a 50 y.o. female patient seen today in office for POV #3 (DOS 07-02-15), S/P Left EPF and Peroneal tendon repair. Patient admits to severe burning and pain inside her cast. Reports that her first cast did not feel this way. Patient reports that she has tried using a blower dryer on the cast with no relief. Thinks that some of the cast materials may be an irritant. Denies any constitutional symptoms, calf pain, or SOB. No other issues noted.   Patient Active Problem List   Diagnosis Date Noted  . Multiple environmental allergies 03/29/2015  . Family history of malignant neoplasm of breast in relative diagnosed when younger than 49 years of age 62/19/2016  . H/O: hypothyroidism 03/29/2015  . Mild intermittent asthma 03/29/2015  . Decreased libido 03/29/2015  . Screening for breast cancer 08/15/2012  . GERD (gastroesophageal reflux disease) 08/15/2012  . General medical exam 08/15/2012  . ADD (attention deficit disorder) 08/15/2012  . Chest pain 08/04/2012  . SOB (shortness of breath) 08/04/2012    Current Outpatient Prescriptions on File Prior to Visit  Medication Sig Dispense Refill  . albuterol (PROVENTIL HFA;VENTOLIN HFA) 108 (90 BASE) MCG/ACT inhaler Inhale 2 puffs into the lungs every 6 (six) hours as needed for wheezing. 1 Inhaler 11  . cephALEXin (KEFLEX) 500 MG capsule Take 1 capsule (500 mg total) by mouth 3 (three) times daily. 30 capsule 0  . cyclobenzaprine (FLEXERIL) 5 MG tablet Take 1 tablet (5 mg total) by mouth 3 (three) times daily as needed for muscle spasms. 90 tablet 1  . EPINEPHrine 0.3 mg/0.3 mL IJ SOAJ injection Inject 0.3 mLs (0.3 mg total) into the muscle once. 1 Device 11  . fluconazole (DIFLUCAN) 150 MG tablet Take 1 tablet (150 mg total) by mouth daily. 1 tablet 0  . HYDROmorphone (DILAUDID) 4 MG tablet Take 1 tablet (4 mg total) by mouth every 6 (six) hours as  needed for severe pain. 40 tablet 0  . ibuprofen (ADVIL,MOTRIN) 600 MG tablet Take 1 tablet (600 mg total) by mouth every 8 (eight) hours as needed. 90 tablet 1  . meloxicam (MOBIC) 15 MG tablet Take 1 tablet (15 mg total) by mouth daily. 30 tablet 3  . nitrofurantoin, macrocrystal-monohydrate, (MACROBID) 100 MG capsule Take 1 capsule (100 mg total) by mouth 2 (two) times daily. 14 capsule 1  . ondansetron (ZOFRAN) 4 MG tablet Take 1 tablet (4 mg total) by mouth every 8 (eight) hours as needed for nausea or vomiting. 20 tablet 0  . testosterone cypionate (DEPOTESTOSTERONE CYPIONATE) 200 MG/ML injection Inject 0.5 mLs (100 mg total) into the muscle every 28 (twenty-eight) days. 3 mL 0   No current facility-administered medications on file prior to visit.    Allergies  Allergen Reactions  . Butrans [Buprenorphine]   . Cataflam [Diclofenac] Nausea And Vomiting  . Codeine   . Demerol [Meperidine]   . Fentanyl   . Latex   . Morphine And Related   . Naproxen   . Percocet [Oxycodone-Acetaminophen]   . Tramadol   . Vicodin [Hydrocodone-Acetaminophen]   . Opana [Oxymorphone Hcl] Nausea And Vomiting and Rash  . Oxycontin [Oxycodone] Nausea And Vomiting and Rash    Objective: There were no vitals filed for this visit.  General: No acute distress, AAOx3  Left foot: Cast clean, dry, and intact, upon removal, Incision sites intact with no gapping or dehiscence at surgical  sites at heel and lateral ankle, mild exscorrations at upper lower leg from scratching, mild swelling to left foot, no erythema, no warmth, no drainage, no signs of infection noted, Capillary fill time <3 seconds in all digits, gross sensation present via light touch to left foot. No pain with calf compression.   Assessment and Plan:  Problem List Items Addressed This Visit    None    Visit Diagnoses    Status post left foot surgery    -  Primary    EPF and Peroneal tendon repair, 07-02-15    Cast discomfort        Left  foot pain           -Patient seen and evaluated -Cast removed -Applied dry sterile dressing to surgical site left foot secured with coban, ACE wrap and stockinet  -Advised patient to make sure to keep dressings clean, dry, and intact to left surgical sites -Dispensed CAM boot to wear on left foot at all times as instructed -Advised patient to continue with Nonweightbearing and to treat the CAM boot just like a cast -Cont with rolling knee scooter and crutches -Advised patient to ice and elevate as necessary  -Patient may use TENs unit/US at upper leg without removing boot as needed for pain -Cont with PRN meds and pain meds as needed -Patient to follow up as scheduled with Dr. Al CorpusHyatt for continued care. In the meantime, patient to call office if any issues or problems arise.   Asencion Islamitorya Tylik Treese, DPM

## 2015-07-19 ENCOUNTER — Other Ambulatory Visit: Payer: Self-pay | Admitting: Podiatry

## 2015-07-19 ENCOUNTER — Telehealth: Payer: Self-pay | Admitting: Podiatry

## 2015-07-19 MED ORDER — CYCLOBENZAPRINE HCL 5 MG PO TABS: 5.0000 mg | ORAL_TABLET | Freq: Three times a day (TID) | ORAL | Status: DC | PRN

## 2015-07-19 NOTE — Telephone Encounter (Signed)
Flexiril refilled as previously.

## 2015-07-19 NOTE — Addendum Note (Signed)
Addended by: Alphia Kava'CONNELL, Indyah Saulnier D on: 07/19/2015 05:16 PM   Modules accepted: Orders

## 2015-07-19 NOTE — Telephone Encounter (Signed)
Patient called during lunch, left a message that she needs a refill on her Flexiril. Please call her at [phone].

## 2015-07-21 ENCOUNTER — Ambulatory Visit: Payer: BLUE CROSS/BLUE SHIELD | Admitting: Obstetrics and Gynecology

## 2015-08-04 ENCOUNTER — Encounter: Payer: BLUE CROSS/BLUE SHIELD | Admitting: Podiatry

## 2015-08-04 ENCOUNTER — Encounter: Payer: Self-pay | Admitting: Podiatry

## 2015-08-04 ENCOUNTER — Ambulatory Visit (INDEPENDENT_AMBULATORY_CARE_PROVIDER_SITE_OTHER): Payer: BLUE CROSS/BLUE SHIELD | Admitting: Podiatry

## 2015-08-04 ENCOUNTER — Ambulatory Visit: Payer: BLUE CROSS/BLUE SHIELD | Admitting: Obstetrics and Gynecology

## 2015-08-04 VITALS — BP 95/58 | HR 92 | Resp 16

## 2015-08-04 DIAGNOSIS — Z9889 Other specified postprocedural states: Secondary | ICD-10-CM

## 2015-08-04 DIAGNOSIS — M7672 Peroneal tendinitis, left leg: Secondary | ICD-10-CM

## 2015-08-04 DIAGNOSIS — M722 Plantar fascial fibromatosis: Secondary | ICD-10-CM

## 2015-08-04 NOTE — Progress Notes (Signed)
She presents today 6 weeks status post EPF left foot as well as peroneal tendon repair left. She states that she continues to use the [employer] Walker in a nonweightbearing fashion.  Objective: Vital signs stable alert and oriented 3. Pulses are palpable. She still has pain on palpation medially continue tubercle of the left heel and along the incision site which appears to be healing very nicely. She has abduction against resistance which is nonpainful. This is a good sign for healing.  Assessment: Slow healing surgical foot left.  Plan: I encouraged partial weightbearing today and I will allow her to not sleep in the boot. I also encouraged a contrast bath. I'll follow up with her in 2 weeks at which time we will consider physical therapy.

## 2015-08-18 ENCOUNTER — Encounter: Payer: BLUE CROSS/BLUE SHIELD | Admitting: Podiatry

## 2015-08-20 ENCOUNTER — Encounter: Payer: Self-pay | Admitting: Sports Medicine

## 2015-08-20 ENCOUNTER — Ambulatory Visit (INDEPENDENT_AMBULATORY_CARE_PROVIDER_SITE_OTHER): Payer: BLUE CROSS/BLUE SHIELD | Admitting: Sports Medicine

## 2015-08-20 VITALS — BP 117/77 | HR 75 | Resp 18

## 2015-08-20 DIAGNOSIS — Z9889 Other specified postprocedural states: Secondary | ICD-10-CM

## 2015-08-20 DIAGNOSIS — M7672 Peroneal tendinitis, left leg: Secondary | ICD-10-CM

## 2015-08-20 DIAGNOSIS — M722 Plantar fascial fibromatosis: Secondary | ICD-10-CM

## 2015-08-20 NOTE — Progress Notes (Signed)
Patient ID: Adriana BeamVicki Moore, female   DOB: 1966/03/03, 50 y.o.   MRN: [id number]  Subjective: Adriana BeamVicki Moore is a 50 y.o. female patient seen today in office for POV #5 (DOS 07-02-15), S/P Left EPF and Peroneal tendon repair. Patient states that she still has a little pain and discomfort at her left foot, however, has been tolerating crutches and partial weightbearing fine with no problems. States that she feels like her calf muscle is getting smaller and more atrophy compared to her nonsurgical side. Denies any constitutional symptoms, calf pain, or SOB. No other issues noted.   Patient Active Problem List   Diagnosis Date Noted  . Multiple environmental allergies 03/29/2015  . Family history of malignant neoplasm of breast in relative diagnosed when younger than 49 years of age 57/19/2016  . H/O: hypothyroidism 03/29/2015  . Mild intermittent asthma 03/29/2015  . Decreased libido 03/29/2015  . Screening for breast cancer 08/15/2012  . GERD (gastroesophageal reflux disease) 08/15/2012  . General medical exam 08/15/2012  . ADD (attention deficit disorder) 08/15/2012  . Chest pain 08/04/2012  . SOB (shortness of breath) 08/04/2012    Current Outpatient Prescriptions on File Prior to Visit  Medication Sig Dispense Refill  . albuterol (PROVENTIL HFA;VENTOLIN HFA) 108 (90 BASE) MCG/ACT inhaler Inhale 2 puffs into the lungs every 6 (six) hours as needed for wheezing. 1 Inhaler 11  . cyclobenzaprine (FLEXERIL) 5 MG tablet TAKE ONE TABLET BY MOUTH 3 TIMES DAILY AS NEEDED FOR MUSCLE SPASMS 90 tablet 1  . cyclobenzaprine (FLEXERIL) 5 MG tablet Take 1 tablet (5 mg total) by mouth 3 (three) times daily as needed for muscle spasms. 90 tablet 1  . EPINEPHrine 0.3 mg/0.3 mL IJ SOAJ injection Inject 0.3 mLs (0.3 mg total) into the muscle once. 1 Device 11  . fluconazole (DIFLUCAN) 150 MG tablet Take 1 tablet (150 mg total) by mouth daily. 1 tablet 0  . HYDROmorphone (DILAUDID) 4 MG tablet Take 1 tablet (4 mg  total) by mouth every 6 (six) hours as needed for severe pain. 40 tablet 0  . ibuprofen (ADVIL,MOTRIN) 600 MG tablet Take 1 tablet (600 mg total) by mouth every 8 (eight) hours as needed. 90 tablet 1  . meloxicam (MOBIC) 15 MG tablet Take 1 tablet (15 mg total) by mouth daily. 30 tablet 3  . nitrofurantoin, macrocrystal-monohydrate, (MACROBID) 100 MG capsule Take 1 capsule (100 mg total) by mouth 2 (two) times daily. 14 capsule 1  . ondansetron (ZOFRAN) 4 MG tablet Take 1 tablet (4 mg total) by mouth every 8 (eight) hours as needed for nausea or vomiting. 20 tablet 0  . testosterone cypionate (DEPOTESTOSTERONE CYPIONATE) 200 MG/ML injection Inject 0.5 mLs (100 mg total) into the muscle every 28 (twenty-eight) days. 3 mL 0   No current facility-administered medications on file prior to visit.    Allergies  Allergen Reactions  . Butrans [Buprenorphine]   . Cataflam [Diclofenac] Nausea And Vomiting  . Codeine   . Demerol [Meperidine]   . Fentanyl   . Latex   . Morphine And Related   . Naproxen   . Percocet [Oxycodone-Acetaminophen]   . Tramadol   . Vicodin [Hydrocodone-Acetaminophen]   . Opana [Oxymorphone Hcl] Nausea And Vomiting and Rash  . Oxycontin [Oxycodone] Nausea And Vomiting and Rash    Objective: There were no vitals filed for this visit.  General: No acute distress, AAOx3  Left foot: Incision sites well healed at heel and lateral ankle, pulses palpable, no edema, no erythema, no  warmth, no drainage, no signs of infection noted, Capillary fill time <3 seconds in all digits, gross sensation present via light touch to left foot. No pain with calf compression. There is mild pain to palpation to the medial heel incision and the distal lateral incision with subjective shooting or burning sensation to area. No pain with range of motion of foot and ankle.   Assessment and Plan:  Problem List Items Addressed This Visit    None    Visit Diagnoses    Peroneal tendinitis, left    -   Primary    Plantar fasciitis, left        Status post left foot surgery        EPF and Peroneal tendon repair 07-02-15       -Patient seen and evaluated -Prescription for physical [facility] at [facility] given; twice a week for 4 weeks for strengthening, joint mobilization, range of motion, and all other treatment modalities as needed -Patient to remain partial weightbearing as instructed by Dr. Al Corpus with CAM boot and crutches to assist her - Patient will return in 2 weeks to determine if she can proceed with full weightbearing pending her progress with physical therapy in the meantime encouraged patient to continue with contrast baths as needed and elevation -Patient to follow up as scheduled with Dr. Al Corpus for continued care. In the meantime, patient to call office if any issues or problems arise.   Asencion Islam, DPM

## 2015-08-26 ENCOUNTER — Ambulatory Visit: Payer: BLUE CROSS/BLUE SHIELD | Admitting: Obstetrics and Gynecology

## 2015-08-30 ENCOUNTER — Encounter: Payer: Self-pay | Admitting: Podiatry

## 2015-08-30 ENCOUNTER — Ambulatory Visit (INDEPENDENT_AMBULATORY_CARE_PROVIDER_SITE_OTHER): Payer: BLUE CROSS/BLUE SHIELD | Admitting: Podiatry

## 2015-08-30 VITALS — BP 118/72 | HR 69 | Resp 16

## 2015-08-30 DIAGNOSIS — M722 Plantar fascial fibromatosis: Secondary | ICD-10-CM

## 2015-08-30 DIAGNOSIS — Z9889 Other specified postprocedural states: Secondary | ICD-10-CM | POA: Diagnosis not present

## 2015-08-30 DIAGNOSIS — M7672 Peroneal tendinitis, left leg: Secondary | ICD-10-CM | POA: Diagnosis not present

## 2015-08-31 NOTE — Progress Notes (Signed)
She presents today for follow-up of her surgical foot right surgery 324 states that she seems to be doing much better.  Objective: Vital signs are stable she is alert and oriented 3 left foot appears to be healing much better minimal tenderness on abduction against resistance amount minimal tenderness on palpation of plantar aspect of the left heel. Much decrease in edema her skin lines as well as her veins and tendons are starting to show once again. She has great range of motion. Much decrease in pain plantarly.  Assessment: Well-healing surgical foot left. She appears to be right on time.  Plan: She will continue physical therapy on an going to place her in a Tri-Lock brace and encouraged her to get away from her crutches and walk only with her Tri-Lock brace at her tissue.

## 2015-09-15 ENCOUNTER — Ambulatory Visit (INDEPENDENT_AMBULATORY_CARE_PROVIDER_SITE_OTHER): Payer: BLUE CROSS/BLUE SHIELD | Admitting: Obstetrics and Gynecology

## 2015-09-15 ENCOUNTER — Encounter: Payer: Self-pay | Admitting: Obstetrics and Gynecology

## 2015-09-15 VITALS — BP 104/66 | HR 83 | Ht 61.0 in | Wt 171.0 lb

## 2015-09-15 DIAGNOSIS — R6882 Decreased libido: Secondary | ICD-10-CM | POA: Diagnosis not present

## 2015-09-15 MED ORDER — TESTOSTERONE CYPIONATE 200 MG/ML IM SOLN
200.0000 mg | Freq: Once | INTRAMUSCULAR | Status: AC
  Administered 2015-09-15: 200 mg via INTRAMUSCULAR

## 2015-09-15 NOTE — Progress Notes (Signed)
Chief complaint: 1.  Decreased libido.  Patient presents for follow-up.  She had received 2 doses of testosterone cypionate in February and March and noted significant in improvement with sex drive, transversely, and orgasm. Due to for injury, and need for surgery, she missed her last two-month appointments and has noted a decrease again in her libido. Patient is here for follow-up management.  Due to the enhancement of patient's sex drive.  The first 2 doses of testosterone cypionate, we will restart the medication on a monthly basis for the next 2 months.  If her libido improves during this timeframe, we will then convert her to topical testosterone cream to be applied on a daily or every other day regimen.  Patient remains comfortable with this plan and wishes to continue.  A total of 15 minutes were spent face-to-face with the patient during this encounter and over half of that time dealt with counseling and coordination of care.  Herold HarmsMartin A Zacary Bauer, MD  Note: This dictation was prepared with Dragon dictation along with smaller phrase technology. Any transcriptional errors that result from this process are unintentional.

## 2015-09-15 NOTE — Patient Instructions (Signed)
1. Testosterone injection is given today 2. Return in 4 weeks for follow-up and final testosterone injection. 3. Your testosterone serum levels will be drawn at next visit

## 2015-09-27 ENCOUNTER — Encounter: Payer: Self-pay | Admitting: Podiatry

## 2015-09-27 ENCOUNTER — Ambulatory Visit (INDEPENDENT_AMBULATORY_CARE_PROVIDER_SITE_OTHER): Payer: BLUE CROSS/BLUE SHIELD | Admitting: Podiatry

## 2015-09-27 ENCOUNTER — Ambulatory Visit (INDEPENDENT_AMBULATORY_CARE_PROVIDER_SITE_OTHER): Payer: BLUE CROSS/BLUE SHIELD

## 2015-09-27 DIAGNOSIS — M79671 Pain in right foot: Secondary | ICD-10-CM

## 2015-09-27 DIAGNOSIS — Z9889 Other specified postprocedural states: Secondary | ICD-10-CM

## 2015-09-27 DIAGNOSIS — S9031XA Contusion of right foot, initial encounter: Secondary | ICD-10-CM | POA: Diagnosis not present

## 2015-09-27 DIAGNOSIS — M7672 Peroneal tendinitis, left leg: Secondary | ICD-10-CM

## 2015-09-27 DIAGNOSIS — M722 Plantar fascial fibromatosis: Secondary | ICD-10-CM | POA: Diagnosis not present

## 2015-09-27 NOTE — Progress Notes (Signed)
She presents today with a chief complaint of pain to the hallux right where she dropped a weight on the great toe. She states that it was black and blue for day but is exquisitely painful right ear she points to the proximal phalanx hallux right. She also presents for follow-up of an endoscopic plantar fasciotomy of the left foot as well as a peroneal tendon repair left foot she continues physical therapy for this. She states it seems to be doing much better but there are some days where she has a significant amount of pain.  Objective: Vital signs are stable alert and oriented 3. Pulses are palpable. Radiographic evaluation of the right hallux does not illustrate any type of fracture. Left foot appears to be healing much better and the pain appears to be reduced considerably.  Assessment: Well-healed surgical foot left. Contusion hallux right.  Plan: Follow up with me in 1 month. I placed her in a night splint for her left foot.

## 2015-10-19 ENCOUNTER — Ambulatory Visit (INDEPENDENT_AMBULATORY_CARE_PROVIDER_SITE_OTHER): Payer: BLUE CROSS/BLUE SHIELD | Admitting: Obstetrics and Gynecology

## 2015-10-19 ENCOUNTER — Encounter: Payer: Self-pay | Admitting: Obstetrics and Gynecology

## 2015-10-19 VITALS — BP 106/69 | HR 83 | Ht 61.0 in | Wt 174.7 lb

## 2015-10-19 DIAGNOSIS — R6882 Decreased libido: Secondary | ICD-10-CM

## 2015-10-19 MED ORDER — TESTOSTERONE CYPIONATE 200 MG/ML IM SOLN
200.0000 mg | Freq: Once | INTRAMUSCULAR | Status: AC
  Administered 2015-10-19: 200 mg via INTRAMUSCULAR

## 2015-10-19 NOTE — Progress Notes (Signed)
Chief complaint: 1. Decreased libido  Patient presents for follow-up on resumption of testosterone therapy for management of decreased libido. She had prior testosterone injections which improved libido and orgasm. Subsequently patient injured foot and discontinued therapy with return of low libido. Since for has healed, she has come back for reinitiation of testosterone therapy. With the first injection last month she noted improvement in her sex drive. Blood levels are drawn today for free and total testosterone. Repeat injection of testosterone cypionate is given today.  OBJECTIVE: BP 106/69 mmHg  Pulse 83  Ht 5\' 1"  (1.549 m)  Wt 174 lb 11.2 oz (79.243 kg)  BMI 33.03 kg/m2  ASSESSMENT: 1. Decreased libido with excellent response to intramuscular testosterone cypionate  PLAN: 1. Free and total testosterone levels are drawn today 2. Testosterone intramuscular injection is given today 3. Return in 4 weeks for follow-up and probable transition to topical testosterone therapy  A total of 15 minutes were spent face-to-face with the patient during this encounter and over half of that time dealt with counseling and coordination of care.  Herold HarmsMartin A Nataliee Shurtz, MD  Note: This dictation was prepared with Dragon dictation along with smaller phrase technology. Any transcriptional errors that result from this process are unintentional.

## 2015-10-19 NOTE — Patient Instructions (Signed)
1. Second testosterone injection is given today 2. Testosterone blood Levels are drawn today 3. Return in 4 weeks for follow-up and transition to topical testosterone therapy

## 2015-10-20 LAB — TESTOSTERONE,FREE AND TOTAL
TESTOSTERONE FREE: 8.4 pg/mL — AB (ref 0.0–4.2)
TESTOSTERONE: 42 ng/dL (ref 8–48)

## 2015-10-25 ENCOUNTER — Encounter: Payer: Self-pay | Admitting: Podiatry

## 2015-10-25 ENCOUNTER — Ambulatory Visit (INDEPENDENT_AMBULATORY_CARE_PROVIDER_SITE_OTHER): Payer: BLUE CROSS/BLUE SHIELD | Admitting: Podiatry

## 2015-10-25 DIAGNOSIS — M7672 Peroneal tendinitis, left leg: Secondary | ICD-10-CM | POA: Diagnosis not present

## 2015-10-25 DIAGNOSIS — Z9889 Other specified postprocedural states: Secondary | ICD-10-CM

## 2015-10-25 DIAGNOSIS — M722 Plantar fascial fibromatosis: Secondary | ICD-10-CM

## 2015-10-25 NOTE — Progress Notes (Signed)
She presents today for follow-up of her left peroneal tendon repair. She states that still gets swelling and tenderness in this area but is doing better than it was.  Objective: Vital signs are stable she's alert and oriented 3 she has some tenderness where the hardening mass to the posterior aspect of the left fibular malleolus. This area appears to be a area of scar tissue more than likely where I resected a portion of the muscle.  Assessment: Well-healing surgical foot still some residual swelling and tenderness left.  Plan: I encouraged massage therapy and I will follow-up with her in 6-8 weeks.

## 2015-11-08 ENCOUNTER — Telehealth: Payer: Self-pay | Admitting: Podiatry

## 2015-11-08 NOTE — Telephone Encounter (Addendum)
Pt states she Dr. Al Corpus had wanted her to have continued PT. I reviewed pt's last office note and she was to begin massage therapy. I told pt I would need orders from Dr. Al Corpus and would call again tomorrow. 11/09/2015-Informed pt Dr. Al Corpus had wanted her to continue the PT she began in 03/2015. Faxed orders to [facility].

## 2015-11-09 NOTE — Telephone Encounter (Signed)
Yes have her to continue current therapy plan for post surgical rehabilitation.

## 2015-11-10 NOTE — Telephone Encounter (Signed)
Orders have been faxed to [facility] for this patient. 11/10/15- th

## 2015-11-24 ENCOUNTER — Ambulatory Visit (INDEPENDENT_AMBULATORY_CARE_PROVIDER_SITE_OTHER): Payer: BLUE CROSS/BLUE SHIELD | Admitting: Obstetrics and Gynecology

## 2015-11-24 ENCOUNTER — Encounter: Payer: Self-pay | Admitting: Obstetrics and Gynecology

## 2015-11-24 VITALS — BP 117/68 | HR 94 | Ht 61.0 in | Wt 169.7 lb

## 2015-11-24 DIAGNOSIS — R6882 Decreased libido: Secondary | ICD-10-CM | POA: Diagnosis not present

## 2015-11-24 NOTE — Progress Notes (Signed)
Chief complaint: 1. Decreased libido  Patient has completed serial injections of testosterone propionate for management of decreased libido. She has had an excellent response to therapy and is now to be converted over to topical testosterone cream therapy.  Testosterone levels drawn at last visit are consistent with improvement in symptomatology.  Patient does understand that conversion to topical cream therapy will not have as dramatic an impact as the intramuscular injections.  All questions have been answered.  ASSESSMENT: 1. Decreased libido excellent response to testosterone therapy 2. Status post completion of intramuscular testosterone propionate injections 3  Plan: 1. Convert to compounded topical testosterone 4% cream therapy. Prescription given 2. Return in 6 months for follow-up  A total of 15 minutes were spent face-to-face with the patient during this encounter and over half of that time dealt with counseling and coordination of care.  Herold HarmsMartin A Ramar Nobrega, MD  Note: This dictation was prepared with Dragon dictation along with smaller phrase technology. Any transcriptional errors that result from this process are unintentional.

## 2015-11-24 NOTE — Patient Instructions (Signed)
1. Apply testosterone cream (compounded) to the mons pubis/clitoris every other day as directed 2. Return in 6 months for follow-up

## 2015-12-02 NOTE — Progress Notes (Signed)
DOS 07/02/2015 Endoscopic plantar fasciotomy left foot, primary repair peroneal tendon left, application of cast left foot

## 2015-12-06 ENCOUNTER — Ambulatory Visit: Payer: BLUE CROSS/BLUE SHIELD | Admitting: Podiatry

## 2015-12-10 ENCOUNTER — Ambulatory Visit
Admission: RE | Admit: 2015-12-10 | Discharge: 2015-12-10 | Disposition: A | Discharge status: Home / Self Care | Payer: BLUE CROSS/BLUE SHIELD | Source: Ambulatory Visit | Attending: Family Medicine | Admitting: Family Medicine

## 2015-12-10 ENCOUNTER — Encounter: Payer: Self-pay | Admitting: Family Medicine

## 2015-12-10 ENCOUNTER — Telehealth: Payer: Self-pay | Admitting: Family Medicine

## 2015-12-10 ENCOUNTER — Ambulatory Visit (INDEPENDENT_AMBULATORY_CARE_PROVIDER_SITE_OTHER): Payer: BLUE CROSS/BLUE SHIELD | Admitting: Family Medicine

## 2015-12-10 VITALS — BP 116/72 | HR 86 | Temp 98.3°F | Resp 16 | Ht 61.0 in | Wt 168.8 lb

## 2015-12-10 DIAGNOSIS — K5900 Constipation, unspecified: Secondary | ICD-10-CM

## 2015-12-10 DIAGNOSIS — K5909 Other constipation: Secondary | ICD-10-CM

## 2015-12-10 MED ORDER — BISACODYL 10 MG RE SUPP: 10.0000 mg | RECTAL | 0 refills | Status: DC | PRN

## 2015-12-10 MED ORDER — LACTULOSE 10 GM/15ML PO SOLN: 10.0000 g | Freq: Every day | ORAL | 0 refills | Status: DC | PRN

## 2015-12-10 NOTE — Patient Instructions (Addendum)
Constipation, Adult Constipation is when a person has fewer than three bowel movements a week, has difficulty having a bowel movement, or has stools that are dry, hard, or larger than normal. As people grow older, constipation is more common. A low-fiber diet, not taking in enough fluids, and taking certain medicines may make constipation worse.  CAUSES   Certain medicines, such as antidepressants, pain medicine, iron supplements, antacids, and water pills.   Certain diseases, such as diabetes, irritable bowel syndrome (IBS), thyroid disease, or depression.   Not drinking enough water.   Not eating enough fiber-rich foods.   Stress or travel.   Lack of physical activity or exercise.   Ignoring the urge to have a bowel movement.   Using laxatives too much.  SIGNS AND SYMPTOMS   Having fewer than three bowel movements a week.   Straining to have a bowel movement.   Having stools that are hard, dry, or larger than normal.   Feeling full or bloated.   Pain in the lower abdomen.   Not feeling relief after having a bowel movement.  DIAGNOSIS  Your health care provider will take a medical history and perform a physical exam. Further testing may be done for severe constipation. Some tests may include:  A barium enema X-ray to examine your rectum, colon, and, sometimes, your small intestine.   A sigmoidoscopy to examine your lower colon.   A colonoscopy to examine your entire colon. TREATMENT  Treatment will depend on the severity of your constipation and what is causing it. Some dietary treatments include drinking more fluids and eating more fiber-rich foods. Lifestyle treatments may include regular exercise. If these diet and lifestyle recommendations do not help, your health care provider may recommend taking over-the-counter laxative medicines to help you have bowel movements. Prescription medicines may be prescribed if over-the-counter medicines do not work.   HOME CARE INSTRUCTIONS   Eat foods that have a lot of fiber, such as fruits, vegetables, whole grains, and beans.  Limit foods high in fat and processed sugars, such as french fries, hamburgers, cookies, candies, and soda.   A fiber supplement may be added to your diet if you cannot get enough fiber from foods.   Drink enough fluids to keep your urine clear or pale yellow.   Exercise regularly or as directed by your health care provider.   Go to the restroom when you have the urge to go. Do not hold it.   Only take over-the-counter or prescription medicines as directed by your health care provider. Do not take other medicines for constipation without talking to your health care provider first.  SEEK IMMEDIATE MEDICAL CARE IF:   You have bright red blood in your stool.   Your constipation lasts for more than 4 days or gets worse.   You have abdominal or rectal pain.   You have thin, pencil-like stools.   You have unexplained weight loss. MAKE SURE YOU:   Understand these instructions.  Will watch your condition.  Will get help right away if you are not doing well or get worse.   This information is not intended to replace advice given to you by your health care provider. Make sure you discuss any questions you have with your health care provider.   Document Released: 12/24/2003 Document Revised: 04/17/2014 Document Reviewed: 01/06/2013 Elsevier Interactive Patient Education 2016 Elsevier Inc. Linaclotide oral capsules What is this medicine? LINACLOTIDE (lin a KLOE tide) is used to treat irritable bowel syndrome (IBS) with  constipation as the main problem. It may also be used for relief of chronic constipation. This medicine may be used for other purposes; ask your health care provider or pharmacist if you have questions. What should I tell my health care provider before I take this medicine? They need to know if you have any of these conditions: -history of  stool (fecal) impaction -now have diarrhea or have diarrhea often -other medical condition -stomach or intestinal disease, including bowel obstruction or abdominal adhesions -an unusual or allergic reaction to linaclotide, other medicines, foods, dyes, or preservatives -pregnant or trying to get pregnant -breast-feeding How should I use this medicine? Take this medicine by mouth with a glass of water. Follow the directions on the prescription label. Do not cut, crush or chew this medicine. Take on an empty stomach, at least 30 minutes before your first meal of the day. Take your medicine at regular intervals. Do not take your medicine more often than directed. Do not stop taking except on your doctor's advice. A special MedGuide will be given to you by the pharmacist with each prescription and refill. Be sure to read this information carefully each time. Talk to your pediatrician regarding the use of this medicine in children. This medicine is not approved for use in children. Overdosage: If you think you have taken too much of this medicine contact a poison control center or emergency room at once. NOTE: This medicine is only for you. Do not share this medicine with others. What if I miss a dose? If you miss a dose, just skip that dose. Wait until your next dose, and take only that dose. Do not take double or extra doses. What may interact with this medicine? -certain medicines for bowel problems or bladder incontinence (these can cause constipation) This list may not describe all possible interactions. Give your health care provider a list of all the medicines, herbs, non-prescription drugs, or dietary supplements you use. Also tell them if you smoke, drink alcohol, or use illegal drugs. Some items may interact with your medicine. What should I watch for while using this medicine? Visit your doctor for regular check ups. Tell your doctor if your symptoms do not get better or if they get  worse. Diarrhea is a common side effect of this medicine. It often begins within 2 weeks of starting this medicine. Stop taking this medicine and call your doctor if you get severe diarrhea. Stop taking this medicine and call your doctor or go to the nearest hospital emergency room right away if you develop unusual or severe stomach-area (abdominal) pain, especially if you also have bright red, bloody stools or black stools that look like tar. What side effects may I notice from receiving this medicine? Side effects that you should report to your doctor or health care professional as soon as possible: -allergic reactions like skin rash, itching or hives, swelling of the face, lips, or tongue -black, tarry stools -bloody or watery diarrhea -new or worsening stomach pain -severe or prolonged diarrhea Side effects that usually do not require medical attention (Report these to your doctor or health care professional if they continue or are bothersome.): -bloating -gas -loose stools This list may not describe all possible side effects. Call your doctor for medical advice about side effects. You may report side effects to FDA at 1-800-FDA-1088. Where should I keep my medicine? Keep out of the reach of children. Store at room temperature between 20 and 25 degrees C (68 and 77 degrees  F). Keep this medicine in the original container. Keep tightly closed in a dry place. Do not remove the desiccant packet from the bottle, it helps to protect your medicine from moisture. Throw away any unused medicine after the expiration date. NOTE: This sheet is a summary. It may not cover all possible information. If you have questions about this medicine, talk to your doctor, pharmacist, or health care provider.    2016, Elsevier/Gold Standard. (2010-12-13 13:05:27)

## 2015-12-10 NOTE — Progress Notes (Signed)
Subjective:    Patient ID: Adriana Moore, female    DOB: [birth year], 50 y.o.   MRN: [id number]  HPI: Adriana Moore is a 50 y.o. female presenting on 12/10/2015 for Constipation (pt had tried several meds not helping and it also causing HA as per pt)   HPI  Pt presents for constipation. This is a longstanding issue. She has tried multiple medications- using fleet enema, fiber, miralax, dulcolax. Had a fleet enema on Saturday without success.   She did have a BM this morning. Can feel hard stool in the rectum. Typically bowel pattern is once per week. This has been long standing her whole life. Type 1 or type 2 stool on bristol stool chart. She has tried multiple over the counter modalities. Has never seen a GI specialist for this issue.  Pt is passing gas.  Past Medical History:  Diagnosis Date  . Allergy   . Anxiety and depression   . Asthma   . Endometriosis   . GERD (gastroesophageal reflux disease)   . Hiatal hernia   . Plantar fasciitis     Current Outpatient Prescriptions on File Prior to Visit  Medication Sig  . albuterol (PROVENTIL HFA;VENTOLIN HFA) 108 (90 BASE) MCG/ACT inhaler Inhale 2 puffs into the lungs every 6 (six) hours as needed for wheezing.  . cyclobenzaprine (FLEXERIL) 5 MG tablet TAKE ONE TABLET BY MOUTH 3 TIMES DAILY AS NEEDED FOR MUSCLE SPASMS  . EPINEPHrine 0.3 mg/0.3 mL IJ SOAJ injection Inject 0.3 mLs (0.3 mg total) into the muscle once.   No current facility-administered medications on file prior to visit.     Review of Systems  Constitutional: Negative for chills and fever.  HENT: Negative.   Respiratory: Negative for cough, chest tightness and wheezing.   Cardiovascular: Negative for chest pain and leg swelling.  Gastrointestinal: Positive for abdominal pain and constipation. Negative for anal bleeding, blood in stool, diarrhea, nausea and vomiting.  Endocrine: Negative.  Negative for cold intolerance, heat intolerance, polydipsia, polyphagia and  polyuria.  Genitourinary: Negative for difficulty urinating and dysuria.  Musculoskeletal: Negative.   Neurological: Negative for dizziness, light-headedness and numbness.  Psychiatric/Behavioral: Negative.    Per HPI unless specifically indicated above     Objective:    BP 116/72 (BP Location: Left Arm, Patient Position: Sitting, Cuff Size: Normal)   Pulse 86   Temp 98.3 F (36.8 C) (Oral)   Resp 16   Ht 5\' 1"  (1.549 m)   Wt 168 lb 12.8 oz (76.6 kg)   BMI 31.89 kg/m   Wt Readings from Last 3 Encounters:  12/10/15 168 lb 12.8 oz (76.6 kg)  11/24/15 169 lb 11.2 oz (77 kg)  10/19/15 174 lb 11.2 oz (79.2 kg)    Physical Exam  Constitutional: She is oriented to person, place, and time. She appears well-developed and well-nourished.  HENT:  Head: Normocephalic and atraumatic.  Neck: Neck supple.  Cardiovascular: Normal rate, regular rhythm and normal heart sounds.  Exam reveals no gallop and no friction rub.   No murmur heard. Pulmonary/Chest: Effort normal and breath sounds normal. She has no wheezes. She exhibits no tenderness.  Abdominal: Soft. Normal appearance. She exhibits no distension and no mass. Bowel sounds are increased. There is tenderness in the right lower quadrant and left lower quadrant. There is no rebound and no guarding.  Mild tenderness RLQ and LLQ.   Musculoskeletal: Normal range of motion. She exhibits no edema or tenderness.  Lymphadenopathy:    She has no  cervical adenopathy.  Neurological: She is alert and oriented to person, place, and time.  Skin: Skin is warm and dry.   Results for orders placed or performed in visit on 10/19/15  Testosterone,Free and Total  Result Value Ref Range   Testosterone 42 8 - 48 ng/dL   Testosterone, Free 8.4 (H) 0.0 - 4.2 pg/mL      Assessment & Plan:   Problem List Items Addressed This Visit      Digestive   Chronic constipation - Primary    KUB to determine stool burden. Bisacodyl suppository today. Discussed  chronic constipation medications such as amitiza and Linzess. Pt would like to do a natural medication. Consider lactulose or GI referral.       Relevant Medications   bisacodyl (DULCOLAX) 10 MG suppository   Other Relevant Orders   DG Abd 1 View    Other Visit Diagnoses   None.     Meds ordered this encounter  Medications  . benzoin compound TINC    Sig: Apply 1 application topically. cream for testosterone  . testosterone cypionate (DEPOTESTOSTERONE CYPIONATE) 200 MG/ML injection    Refill:  1  . bisacodyl (DULCOLAX) 10 MG suppository    Sig: Place 1 suppository (10 mg total) rectally as needed for moderate constipation.    Dispense:  12 suppository    Refill:  0    Order Specific Question:   Supervising Provider    Answer:   Janeann ForehandHAWKINS JR, JAMES H [[id number]]      Follow up plan: Return in about 2 weeks (around 12/24/2015), or if symptoms worsen or fail to improve.

## 2015-12-10 NOTE — Telephone Encounter (Signed)
Reviewed XR results. Pt would like to try lactulose.  Will send 10g/10115ml once daily to pharmacy can increase to twice daily if needed.

## 2015-12-10 NOTE — Assessment & Plan Note (Signed)
KUB to determine stool burden. Bisacodyl suppository today. Discussed chronic constipation medications such as amitiza and Linzess. Pt would like to do a natural medication. Consider lactulose or GI referral.

## 2015-12-14 ENCOUNTER — Encounter: Payer: Self-pay | Admitting: Podiatry

## 2015-12-14 ENCOUNTER — Encounter: Payer: Self-pay | Admitting: Family Medicine

## 2015-12-29 ENCOUNTER — Telehealth: Payer: Self-pay | Admitting: *Deleted

## 2015-12-29 MED ORDER — CYCLOBENZAPRINE HCL 5 MG PO TABS: 5.0000 mg | ORAL_TABLET | Freq: Three times a day (TID) | ORAL | 0 refills | Status: DC

## 2015-12-29 NOTE — Telephone Encounter (Signed)
Informed pt by email, flexeril had been reordered for 1 month and she needed a follow up appt.

## 2015-12-30 NOTE — Telephone Encounter (Signed)
lvm for pt to call me to schedule an appt

## 2016-01-19 ENCOUNTER — Ambulatory Visit (INDEPENDENT_AMBULATORY_CARE_PROVIDER_SITE_OTHER): Payer: BLUE CROSS/BLUE SHIELD | Admitting: Podiatry

## 2016-01-19 ENCOUNTER — Encounter: Payer: Self-pay | Admitting: Podiatry

## 2016-01-19 DIAGNOSIS — M7672 Peroneal tendinitis, left leg: Secondary | ICD-10-CM

## 2016-01-19 DIAGNOSIS — Z9889 Other specified postprocedural states: Secondary | ICD-10-CM

## 2016-01-19 DIAGNOSIS — M5432 Sciatica, left side: Secondary | ICD-10-CM

## 2016-01-19 DIAGNOSIS — M722 Plantar fascial fibromatosis: Secondary | ICD-10-CM

## 2016-01-20 NOTE — Progress Notes (Signed)
She presents for follow-up of her endoscopic fasciotomy. He'll tendon repair of her left foot. She states that she still had a significant pain even after physical therapy that really nothing seems to be changing if anything things seem to be getting worse. She states that even the physical therapist stated that it seems like it was something else troubling her rather than just the surgical sites. She is now complaining of pain centrally located in the plantar aspect of the foot which is new as well as pain along the surgical sites.  Objective: Vital signs are stable she is alert and oriented 3 all surgical sites along the medial nearly 100% margin contours appear to be normal soft tissue margins. The normal range of motion appears to be normal without significant pain on range of motion. She does have pain on deep palpation of these surgical sites and of the plantar aspect of the foot and the dorsal lateral aspect of the left foot as well. I performed a straight leg test today in the office and the patient has significant worsening of her pain this is indicating more than likely sciatica-type symptoms and contributions from the spine or buttocks.  Assessment: Sciatica resulting in delay in lessening of her surgical symptoms.  Plan: I offered to refer her to neurosurgery evaluation to neurology but she declined. She would like to consider massage therapy which helped with sciatica on her contralateral hip and back and if that fails she would like to consider chiropractic doctor. Should she desire neurosurgery or neurology I recommended that she contact us.

## 2016-05-30 ENCOUNTER — Ambulatory Visit: Payer: BLUE CROSS/BLUE SHIELD | Admitting: Obstetrics and Gynecology

## 2016-09-01 IMAGING — MG MM DIGITAL SCREENING BILAT W/ CAD
4 series · 4 of 4 positions shown · non-contrast
Comparison: Previous exam(s).

CLINICAL DATA: Screening.

EXAM:
DIGITAL SCREENING BILATERAL MAMMOGRAM WITH CAD

[R CC]
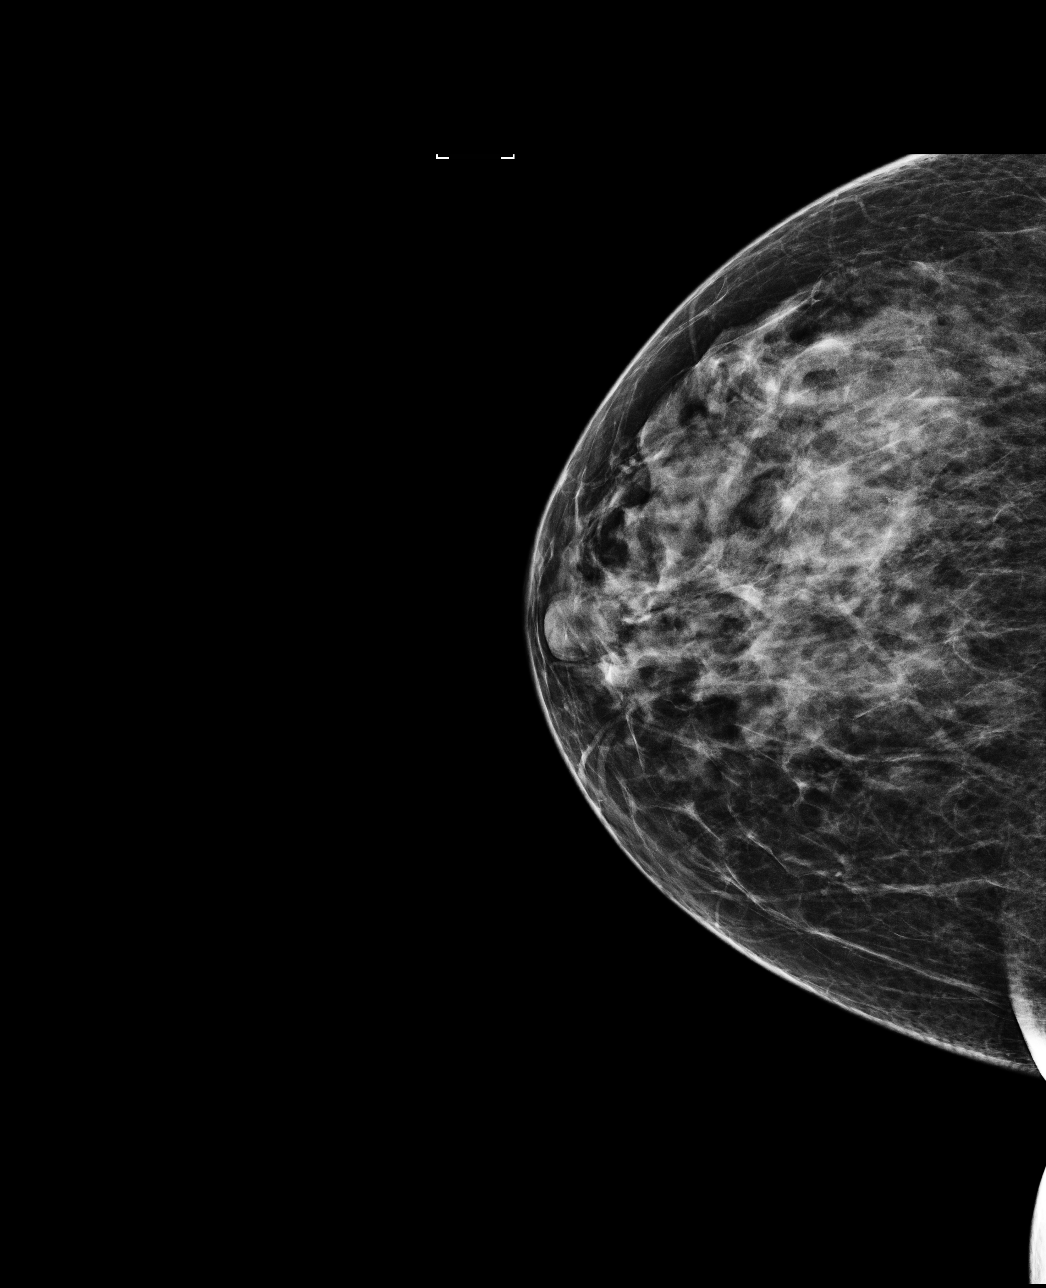

[L CC]
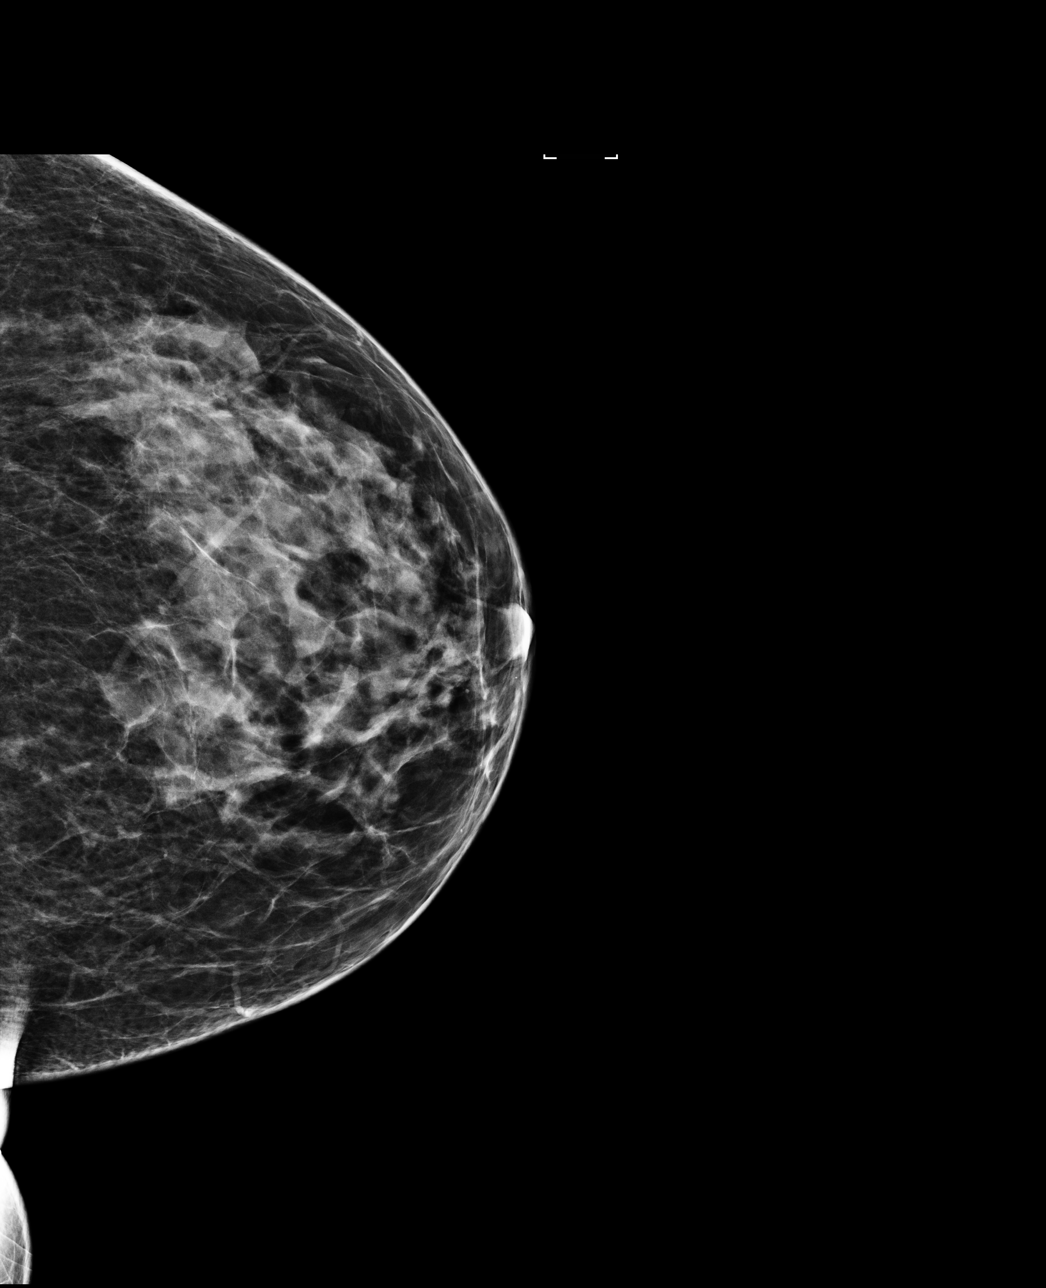

[R MLO]
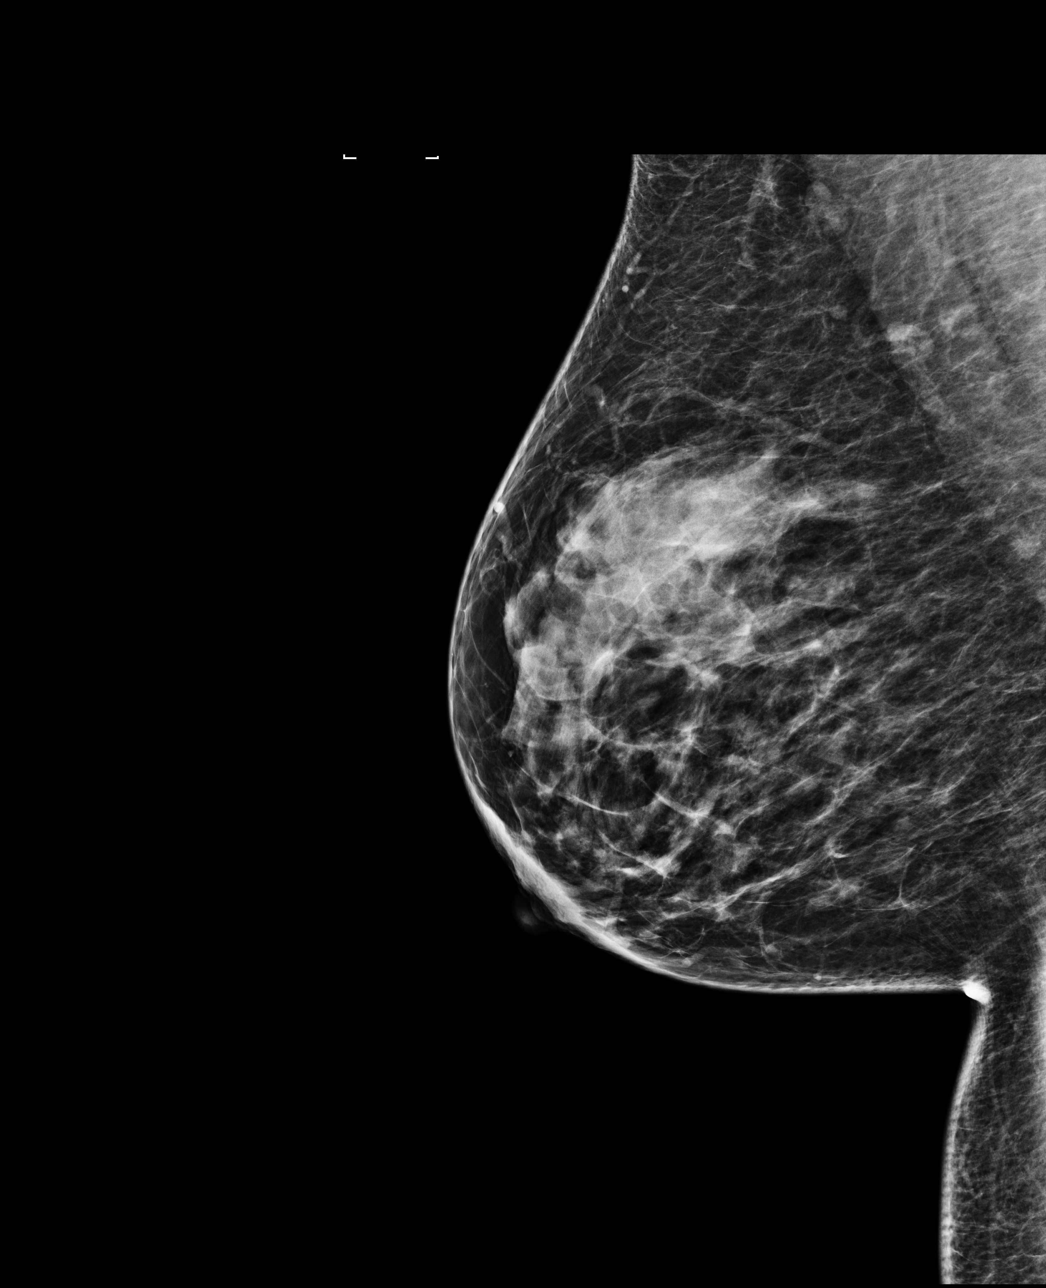

[L MLO]
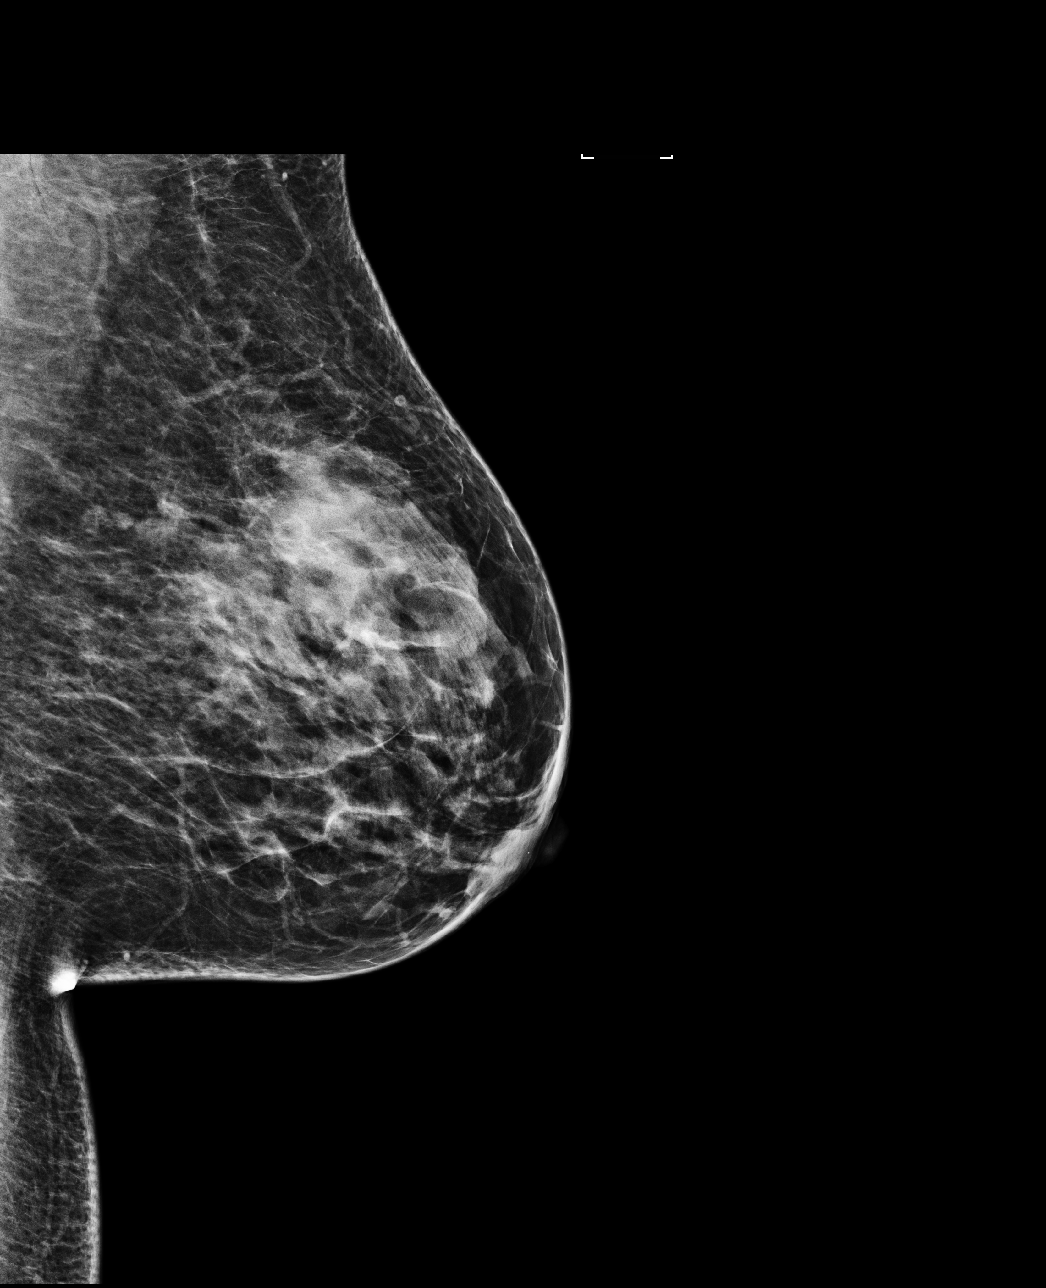

[4 of 4 positions shown; findings below may reference images not displayed]

ACR Breast Density Category c: The breast tissue is heterogeneously
dense, which may obscure small masses.
FINDINGS: There are no findings suspicious for malignancy. Images were
processed with CAD.
IMPRESSION: No mammographic evidence of malignancy. A result letter of this
screening mammogram will be mailed directly to the patient.

RECOMMENDATION:
Screening mammogram in one year. (Code:[id number])

BI-RADS CATEGORY  1: Negative.

## 2017-10-24 ENCOUNTER — Other Ambulatory Visit (INDEPENDENT_AMBULATORY_CARE_PROVIDER_SITE_OTHER): Payer: BLUE CROSS/BLUE SHIELD

## 2017-10-24 ENCOUNTER — Ambulatory Visit: Payer: BLUE CROSS/BLUE SHIELD | Admitting: Obstetrics and Gynecology

## 2017-10-24 ENCOUNTER — Encounter: Payer: Self-pay | Admitting: Obstetrics and Gynecology

## 2017-10-24 VITALS — BP 125/73 | HR 85 | Ht 61.0 in | Wt 193.1 lb

## 2017-10-24 DIAGNOSIS — Z800 Family history of malignant neoplasm of digestive organs: Secondary | ICD-10-CM | POA: Diagnosis not present

## 2017-10-24 DIAGNOSIS — R102 Pelvic and perineal pain: Secondary | ICD-10-CM | POA: Diagnosis not present

## 2017-10-24 DIAGNOSIS — N939 Abnormal uterine and vaginal bleeding, unspecified: Secondary | ICD-10-CM | POA: Diagnosis not present

## 2017-10-24 DIAGNOSIS — Z90710 Acquired absence of both cervix and uterus: Secondary | ICD-10-CM | POA: Insufficient documentation

## 2017-10-24 DIAGNOSIS — Z8742 Personal history of other diseases of the female genital tract: Secondary | ICD-10-CM

## 2017-10-24 DIAGNOSIS — Z1371 Encounter for nonprocreative screening for genetic disease carrier status: Secondary | ICD-10-CM | POA: Diagnosis not present

## 2017-10-24 DIAGNOSIS — Z6379 Other stressful life events affecting family and household: Secondary | ICD-10-CM | POA: Diagnosis not present

## 2017-10-24 DIAGNOSIS — Z803 Family history of malignant neoplasm of breast: Secondary | ICD-10-CM

## 2017-10-24 DIAGNOSIS — Z90721 Acquired absence of ovaries, unilateral: Secondary | ICD-10-CM

## 2017-10-24 LAB — POCT URINALYSIS DIPSTICK
Bilirubin, UA: NEGATIVE
Blood, UA: NEGATIVE
GLUCOSE UA: NEGATIVE
Ketones, UA: NEGATIVE
Nitrite, UA: NEGATIVE
ODOR: NEGATIVE
PROTEIN UA: POSITIVE — AB
Spec Grav, UA: 1.025 (ref 1.010–1.025)
Urobilinogen, UA: 0.2 E.U./dL
pH, UA: 6 (ref 5.0–8.0)

## 2017-10-24 NOTE — Patient Instructions (Signed)
1.  Urinalysis and urine culture is obtained today. 2.  Screening colonoscopy is recommended due to family history of colon cancer 3.  Pelvic ultrasound is scheduled to assess etiology of pelvic pain, due to history of endometriosis 4.  Return in 4 weeks for follow-up 5.  Literature on genetic testing for GI cancers is given

## 2017-10-24 NOTE — Progress Notes (Signed)
GYN ENCOUNTER NOTE  Subjective:       Adriana Moore is a 52 y.o. [phone] female is here for gynecologic evaluation of the following issues:  1.  Vaginal bleeding.   2.  Pelvic pain  History of endometriosis, status post TAH LSO in 2014. History of decreased libido, treated in the past year for low libido with testosterone intramuscular injections, followed by topical therapy; this has been discontinued within the past 6 months.  Patient has had increased stressors of late with caring for father, now recently deceased from colon cancer, and caring for him at end of life at hospice. Son also had new diagnosis of seizure disorder.  1 week ago patient developed bright red bleeding from the vagina?.  This is associated with bilateral pelvic cramping.  Bleeding has diminished to spotting being identified only when wiping. She reports no UTI symptoms.  She notes no hematuria. She reports no change in bowel function.  No reported history of bright red blood per rectum or black tarry stools.  No pain with bowel movements.  Patient has not had screening colonoscopy.  Patient is BRCA negative from genetic testing due to family history of breast cancer in mom and maternal grandmother.  She did not have any genetic testing for GI risk factors.   Gynecologic History No LMP recorded. Patient has had a hysterectomy. Status post TAH LSO History of endometriosis  Obstetric History OB History  Gravida Para Term Preterm AB Living  '6 3 2 1 3 2  ' SAB TAB Ectopic Multiple Live Births  3       2    # Outcome Date GA Lbr Len/2nd Weight Sex Delivery Anes PTL Lv  6 SAB 1992          5 SAB 1990          4 Term 1989   7 lb (3.175 kg) M Vag-Spont   LIV  3 SAB 1987          2 Term 1986   6 lb (2.722 kg) F Vag-Spont   LIV  1 Preterm 1985     Vag-Spont   FD    Past Medical History:  Diagnosis Date  . Allergy   . Anxiety and depression   . Asthma   . Endometriosis   . GERD (gastroesophageal reflux  disease)   . Hiatal hernia   . Plantar fasciitis     Past Surgical History:  Procedure Laterality Date  . ABDOMINAL HYSTERECTOMY     endometriosis  . BREAST BIOPSY Right 2004   EXCISIONAL - NEG  . BREAST SURGERY     breast biopsy  . CHOLECYSTECTOMY    . DILATION AND CURETTAGE OF UTERUS     x 2  . FOOT SURGERY    . HERNIA REPAIR  2014   hital hernia repain   . KNEE SURGERY      Current Outpatient Medications on File Prior to Visit  Medication Sig Dispense Refill  . EPINEPHrine 0.3 mg/0.3 mL IJ SOAJ injection Inject 0.3 mLs (0.3 mg total) into the muscle once. 1 Device 11   No current facility-administered medications on file prior to visit.     Allergies  Allergen Reactions  . Butrans [Buprenorphine]   . Cataflam [Diclofenac] Nausea And Vomiting  . Codeine   . Demerol [Meperidine]   . Fentanyl   . Latex   . Morphine And Related   . Naproxen   . Percocet [Oxycodone-Acetaminophen]   . Tramadol   .  Vicodin [Hydrocodone-Acetaminophen]   . Opana [Oxymorphone Hcl] Nausea And Vomiting and Rash  . Oxycontin [Oxycodone] Nausea And Vomiting and Rash    Social History   Socioeconomic History  . Marital status: Married    Spouse name: Not on file  . Number of children: Not on file  . Years of education: Not on file  . Highest education level: Not on file  Occupational History  . Not on file  Social Needs  . Financial resource strain: Not on file  . Food insecurity:    Worry: Not on file    Inability: Not on file  . Transportation needs:    Medical: Not on file    Non-medical: Not on file  Tobacco Use  . Smoking status: Former Smoker    Packs/day: 0.50    Years: 15.00    Pack years: 7.50    Last attempt to quit: 04/10/2010    Years since quitting: 7.5  . Smokeless tobacco: Never Used  Substance and Sexual Activity  . Alcohol use: Yes    Comment: rare use  . Drug use: No  . Sexual activity: Yes    Birth control/protection: Surgical    Comment: hysterectomy   Lifestyle  . Physical activity:    Days per week: Not on file    Minutes per session: Not on file  . Stress: Not on file  Relationships  . Social connections:    Talks on phone: Not on file    Gets together: Not on file    Attends religious service: Not on file    Active member of club or organization: Not on file    Attends meetings of clubs or organizations: Not on file    Relationship status: Not on file  . Intimate partner violence:    Fear of current or ex partner: Not on file    Emotionally abused: Not on file    Physically abused: Not on file    Forced sexual activity: Not on file  Other Topics Concern  . Not on file  Social History Narrative  . Not on file    Family History  Problem Relation Age of Onset  . Heart attack Mother   . Cancer Mother 17       Breast Cancer  . Diabetes Mother   . Breast cancer Mother        56'S  . Heart disease Father   . Hyperlipidemia Sister   . Cancer Brother        renal cell cancer  . Heart disease Brother   . Cancer Maternal Grandmother        Breast cancer  . Heart disease Maternal Grandfather        CHF  . Heart disease Paternal Grandfather     The following portions of the patient's history were reviewed and updated as appropriate: allergies, current medications, past family history, past medical history, past social history, past surgical history and problem list.  Review of Systems Review of Systems -comprehensive review of systems is negative except for that noted in the HPI  Objective:   BP 125/73   Pulse 85   Ht '5\' 1"'  (1.549 m)   Wt 193 lb 1.6 oz (87.6 kg)   BMI 36.49 kg/m  CONSTITUTIONAL: Well-developed, well-nourished female in no acute distress.  HENT:  Normocephalic, atraumatic.  NECK: Normal range of motion, supple, no masses.  Normal thyroid.  SKIN: Skin is warm and dry. No rash noted. Not diaphoretic. No erythema.  No pallor. [place]: Alert and oriented to person, place, and time.  PSYCHIATRIC:  Normal mood and affect. Normal behavior. Normal judgment and thought content.  Patient is anxious. CARDIOVASCULAR:Not Examined RESPIRATORY: Not Examined BACK: No CVA tenderness or spinal tenderness BREASTS: Not Examined ABDOMEN: Soft, non distended; Non tender.  No Organomegaly. PELVIC:  External Genitalia: Normal  BUS: Normal  Vagina: Normal; good estrogen effect; excellent pelvic support; no evidence of vaginal bleeding or vaginal discharge; on palpation vaginal cuff is tender without palpable lesions  Cervix: Surgically absent  Uterus: Surgically absent  Adnexa: Nonpalpable and nontender  RV: Normal external exam; excellent sphincter tone; granular stool in rectal vault without evidence of mass  Bladder: Nontender MUSCULOSKELETAL: Normal range of motion. No tenderness.  No cyanosis, clubbing, or edema.     Assessment:   1. Vaginal bleeding - Urine Culture - POCT urinalysis dipstick  2. Status post hysterectomy LSO  3. History of endometriosis  4.  Pelvic pain, bilateral cramping  5.  BRCA negative  6.  Family history of breast cancer-mother and maternal grandmother  17.  Family history of colon cancer-father; no history of colonoscopy  8.  No identified source of bleeding  9.  Life stressors     Plan:   1.  UA with C&S 2.  Pelvic ultrasound 3.  Screening colonoscopy ordered 4.  Literature on genetic testing for GI cancers given 5.  Return in 4 weeks for annual exam  A total of 25 minutes were spent face-to-face with the patient during this encounter and over half of that time involved counseling and coordination of care.  Brayton Mars, MD  Note: This dictation was prepared with Dragon dictation along with smaller phrase technology. Any transcriptional errors that result from this process are unintentional.

## 2017-10-26 LAB — URINE CULTURE: Organism ID, Bacteria: NO GROWTH

## 2017-11-01 ENCOUNTER — Other Ambulatory Visit: Payer: Self-pay

## 2017-11-01 ENCOUNTER — Telehealth: Payer: Self-pay

## 2017-11-01 ENCOUNTER — Telehealth: Payer: Self-pay | Admitting: Gastroenterology

## 2017-11-01 DIAGNOSIS — Z1211 Encounter for screening for malignant neoplasm of colon: Secondary | ICD-10-CM

## 2017-11-01 MED ORDER — NA SULFATE-K SULFATE-MG SULF 17.5-3.13-1.6 GM/177ML PO SOLN: 1.0000 | Freq: Once | ORAL | 0 refills | Status: AC

## 2017-11-01 NOTE — Telephone Encounter (Signed)
Unable to contact patient to return call.  Husband stated she had just left.  Unable to leave voice message because voice mail is not available.  Thanks [employer]

## 2017-11-01 NOTE — Telephone Encounter (Signed)
Patient called back needing to schedule her colonoscopy and would like a returned call today.

## 2017-11-05 ENCOUNTER — Telehealth: Payer: Self-pay | Admitting: Gastroenterology

## 2017-11-05 NOTE — Telephone Encounter (Signed)
Pt left vm she states she is looking at an e-mail and her prep and it is [facility]. Please call pt to clarify

## 2017-11-05 NOTE — Telephone Encounter (Signed)
Patients call has been returned.  She received her fathers colonoscopy instructions and thought they were hers.  She has received her instructions and is aware that [facility], and understands her prepping instructions.  Thanks [employer]

## 2017-11-14 ENCOUNTER — Encounter: Payer: Self-pay | Admitting: Student

## 2017-11-15 ENCOUNTER — Ambulatory Visit: Payer: BLUE CROSS/BLUE SHIELD | Admitting: Anesthesiology

## 2017-11-15 ENCOUNTER — Encounter: Payer: Self-pay | Admitting: Gastroenterology

## 2017-11-15 ENCOUNTER — Ambulatory Visit
Admission: RE | Admit: 2017-11-15 | Discharge: 2017-11-15 | Disposition: A | Discharge status: Home / Self Care | Payer: BLUE CROSS/BLUE SHIELD | Source: Ambulatory Visit | Attending: Gastroenterology | Admitting: Gastroenterology

## 2017-11-15 ENCOUNTER — Encounter
Admission: RE | Disposition: A | Discharge status: Home / Self Care | Payer: Self-pay | Source: Ambulatory Visit | Attending: Gastroenterology

## 2017-11-15 DIAGNOSIS — Z885 Allergy status to narcotic agent status: Secondary | ICD-10-CM | POA: Diagnosis not present

## 2017-11-15 DIAGNOSIS — Z888 Allergy status to other drugs, medicaments and biological substances status: Secondary | ICD-10-CM | POA: Diagnosis not present

## 2017-11-15 DIAGNOSIS — F419 Anxiety disorder, unspecified: Secondary | ICD-10-CM | POA: Insufficient documentation

## 2017-11-15 DIAGNOSIS — K219 Gastro-esophageal reflux disease without esophagitis: Secondary | ICD-10-CM | POA: Diagnosis not present

## 2017-11-15 DIAGNOSIS — Z1211 Encounter for screening for malignant neoplasm of colon: Secondary | ICD-10-CM | POA: Diagnosis present

## 2017-11-15 DIAGNOSIS — J45909 Unspecified asthma, uncomplicated: Secondary | ICD-10-CM | POA: Insufficient documentation

## 2017-11-15 DIAGNOSIS — Z87891 Personal history of nicotine dependence: Secondary | ICD-10-CM | POA: Insufficient documentation

## 2017-11-15 DIAGNOSIS — Z800 Family history of malignant neoplasm of digestive organs: Secondary | ICD-10-CM | POA: Diagnosis not present

## 2017-11-15 DIAGNOSIS — Z8249 Family history of ischemic heart disease and other diseases of the circulatory system: Secondary | ICD-10-CM | POA: Diagnosis not present

## 2017-11-15 DIAGNOSIS — F329 Major depressive disorder, single episode, unspecified: Secondary | ICD-10-CM | POA: Diagnosis not present

## 2017-11-15 DIAGNOSIS — Z91040 Latex allergy status: Secondary | ICD-10-CM | POA: Diagnosis not present

## 2017-11-15 HISTORY — PX: COLONOSCOPY WITH PROPOFOL: SHX5780

## 2017-11-15 SURGERY — COLONOSCOPY WITH PROPOFOL
Anesthesia: General

## 2017-11-15 MED ORDER — MIDAZOLAM HCL 5 MG/5ML IJ SOLN
INTRAMUSCULAR | Status: DC | PRN
  Administered 2017-11-15: 2 mg via INTRAVENOUS

## 2017-11-15 MED ORDER — LIDOCAINE HCL (PF) 2 % IJ SOLN
INTRAMUSCULAR | Status: AC
  Filled 2017-11-15: qty 10

## 2017-11-15 MED ORDER — PROPOFOL 10 MG/ML IV BOLUS
INTRAVENOUS | Status: DC | PRN
  Administered 2017-11-15 (×2): 42 mg via INTRAVENOUS

## 2017-11-15 MED ORDER — SODIUM CHLORIDE 0.9 % IV SOLN
INTRAVENOUS | Status: DC
  Administered 2017-11-15: 1000 mL via INTRAVENOUS

## 2017-11-15 MED ORDER — PROPOFOL 500 MG/50ML IV EMUL
INTRAVENOUS | Status: AC
  Filled 2017-11-15: qty 50

## 2017-11-15 MED ORDER — MIDAZOLAM HCL 2 MG/2ML IJ SOLN
INTRAMUSCULAR | Status: AC
  Filled 2017-11-15: qty 2

## 2017-11-15 MED ORDER — PROPOFOL 500 MG/50ML IV EMUL
INTRAVENOUS | Status: DC | PRN
  Administered 2017-11-15: 130 ug/kg/min via INTRAVENOUS

## 2017-11-15 NOTE — Transfer of Care (Signed)
Immediate Anesthesia Transfer of Care Note  Patient: Adriana Moore  Procedure(s) Performed: COLONOSCOPY WITH PROPOFOL (N/A )  Patient Location: PACU and Endoscopy Unit  Anesthesia Type:General  Level of Consciousness: sedated  Airway & Oxygen Therapy: Patient Spontanous Breathing and Patient connected to nasal cannula oxygen  Post-op Assessment: Report given to RN and Post -op Vital signs reviewed and stable  Post vital signs: Reviewed  Last Vitals:  Vitals Value Taken Time  BP    Temp    Pulse 92 11/15/2017  9:19 AM  Resp 12 11/15/2017  9:19 AM  SpO2 100 % 11/15/2017  9:19 AM  Vitals shown include unvalidated device data.  Last Pain:  Vitals:   11/15/17 0738  TempSrc: Tympanic  PainSc: 0-No pain         Complications: No apparent anesthesia complications

## 2017-11-15 NOTE — Anesthesia Postprocedure Evaluation (Signed)
Anesthesia Post Note  Patient: Adriana Moore  Procedure(s) Performed: COLONOSCOPY WITH PROPOFOL (N/A )  Patient location during evaluation: Endoscopy Anesthesia Type: General Level of consciousness: awake and alert Pain management: pain level controlled Vital Signs Assessment: post-procedure vital signs reviewed and stable Respiratory status: spontaneous breathing and respiratory function stable Cardiovascular status: stable Anesthetic complications: no     Last Vitals:  Vitals:   11/15/17 0738 11/15/17 0920  BP: 112/74 94/63  Pulse: 82 92  Resp: 16 12  Temp: (!) 36.2 C (!) 36.1 C  SpO2: 100% 100%    Last Pain:  Vitals:   11/15/17 0920  TempSrc: Tympanic  PainSc:                  KEPHART,WILLIAM K

## 2017-11-15 NOTE — H&P (Signed)
Adriana MoodKiran Yaneliz Radebaugh, MD [address], [address], [place], [place], [id number] [address], [address], [place], [place], [id number] Phone: [phone]  Fax: [phone]  Primary Care Physician:  Armando GangLindley, Cheryl P, FNP   Pre-Procedure History & Physical: HPI:  Adriana BeamVicki Moore is a 52 y.o. female is here for an colonoscopy.   Past Medical History:  Diagnosis Date  . Allergy   . Anxiety and depression   . Asthma   . Endometriosis   . GERD (gastroesophageal reflux disease)   . Hiatal hernia   . Plantar fasciitis     Past Surgical History:  Procedure Laterality Date  . ABDOMINAL HYSTERECTOMY     endometriosis  . BREAST BIOPSY Right 2004   EXCISIONAL - NEG  . BREAST SURGERY     breast biopsy  . CHOLECYSTECTOMY    . DILATION AND CURETTAGE OF UTERUS     x 2  . FOOT SURGERY    . HERNIA REPAIR  2014   hital hernia repain   . KNEE SURGERY      Prior to Admission medications   Medication Sig Start Date End Date Taking? Authorizing Provider  EPINEPHrine 0.3 mg/0.3 mL IJ SOAJ injection Inject 0.3 mLs (0.3 mg total) into the muscle once. Patient not taking: Reported on 11/15/2017 03/29/15   Loura PardonKrebs, Amy Lauren, NP    Allergies as of 11/01/2017 - Review Complete 10/24/2017  Allergen Reaction Noted  . Butrans [buprenorphine]  08/01/2012  . Cataflam [diclofenac] Nausea And Vomiting 11/06/2012  . Codeine  07/31/2012  . Demerol [meperidine]  08/01/2012  . Fentanyl  08/01/2012  . Latex  07/31/2012  . Morphine and related  07/31/2012  . Naproxen  08/01/2012  . Percocet [oxycodone-acetaminophen]  07/31/2012  . Tramadol  08/01/2012  . Vicodin [hydrocodone-acetaminophen]  07/31/2012  . Opana [oxymorphone hcl] Nausea And Vomiting and Rash 11/06/2012  . Oxycontin [oxycodone] Nausea And Vomiting and Rash 11/06/2012    Family History  Problem Relation Age of Onset  . Heart attack Mother   . Cancer Mother [id number]       Breast Cancer  . Diabetes Mother   . Breast cancer Mother        8040'S    . Heart disease Father   . Hyperlipidemia Sister   . Cancer Brother        renal cell cancer  . Heart disease Brother   . Cancer Maternal Grandmother        Breast cancer  . Heart disease Maternal Grandfather        CHF  . Heart disease Paternal Grandfather     Social History   Socioeconomic History  . Marital status: Married    Spouse name: Not on file  . Number of children: Not on file  . Years of education: Not on file  . Highest education level: Not on file  Occupational History  . Not on file  Social Needs  . Financial resource strain: Not on file  . Food insecurity:    Worry: Not on file    Inability: Not on file  . Transportation needs:    Medical: Not on file    Non-medical: Not on file  Tobacco Use  . Smoking status: Former Smoker    Packs/day: 0.50    Years: 15.00    Pack years: 7.50    Last attempt to quit: 04/10/2010    Years since quitting: 7.6  . Smokeless tobacco: Never Used  Substance and Sexual Activity  .  Alcohol use: Yes    Comment: rare use  . Drug use: No  . Sexual activity: Yes    Birth control/protection: Surgical    Comment: hysterectomy  Lifestyle  . Physical activity:    Days per week: Not on file    Minutes per session: Not on file  . Stress: Not on file  Relationships  . Social connections:    Talks on phone: Not on file    Gets together: Not on file    Attends religious service: Not on file    Active member of club or organization: Not on file    Attends meetings of clubs or organizations: Not on file    Relationship status: Not on file  . Intimate partner violence:    Fear of current or ex partner: Not on file    Emotionally abused: Not on file    Physically abused: Not on file    Forced sexual activity: Not on file  Other Topics Concern  . Not on file  Social History Narrative  . Not on file    Review of Systems: See HPI, otherwise negative ROS  Physical Exam: BP 112/74   Pulse 82   Temp (!) 97.1 F (36.2 C)  (Tympanic)   Resp 16   Ht 5\' 1"  (1.549 m)   Wt 83.9 kg   SpO2 100%   BMI 34.96 kg/m  General:   Alert,  pleasant and cooperative in NAD Head:  Normocephalic and atraumatic. Neck:  Supple; no masses or thyromegaly. Lungs:  Clear throughout to auscultation, normal respiratory effort.    Heart:  +S1, +S2, Regular rate and rhythm, No edema. Abdomen:  Soft, nontender and nondistended. Normal bowel sounds, without guarding, and without rebound.   Neurologic:  Alert and  oriented x4;  grossly normal neurologically.  Impression/Plan: Adriana Moore is here for an colonoscopy to be performed for Screening colonoscopy high risk   Risks, benefits, limitations, and alternatives regarding  colonoscopy have been reviewed with the patient.  Questions have been answered.  All parties agreeable.   Adriana Mood, MD  11/15/2017, 8:30 AM

## 2017-11-15 NOTE — Anesthesia Post-op Follow-up Note (Signed)
Anesthesia QCDR form completed.        

## 2017-11-15 NOTE — Anesthesia Preprocedure Evaluation (Signed)
Anesthesia Evaluation  Patient identified by MRN, date of birth, ID band Patient awake    Reviewed: Allergy & Precautions, NPO status , Patient's Chart, lab work & pertinent test results  History of Anesthesia Complications Negative for: history of anesthetic complications  Airway Mallampati: II       Dental   Pulmonary asthma (last used inhaler 6 months ago) , neg sleep apnea, neg COPD, former smoker,           Cardiovascular (-) hypertension(-) Past MI and (-) CHF (-) dysrhythmias (-) Valvular Problems/Murmurs     Neuro/Psych Anxiety    GI/Hepatic Neg liver ROS, hiatal hernia, GERD  Medicated,  Endo/Other  neg diabetes  Renal/GU negative Renal ROS     Musculoskeletal   Abdominal   Peds  Hematology   Anesthesia Other Findings   Reproductive/Obstetrics                             Anesthesia Physical Anesthesia Plan  ASA: II  Anesthesia Plan: General   Post-op Pain Management:    Induction:   PONV Risk Score and Plan: 3 and TIVA, Propofol infusion and Midazolam  Airway Management Planned:   Additional Equipment:   Intra-op Plan:   Post-operative Plan:   Informed Consent: I have reviewed the patients History and Physical, chart, labs and discussed the procedure including the risks, benefits and alternatives for the proposed anesthesia with the patient or authorized representative who has indicated his/her understanding and acceptance.     Plan Discussed with:   Anesthesia Plan Comments:         Anesthesia Quick Evaluation

## 2017-11-15 NOTE — Op Note (Signed)
[facility] Patient Name: Adriana Moore Procedure Date: 11/15/2017 8:39 AM MRN: [id number] Account #: [id number] Date of Birth: [birth year] Admit Type: Outpatient Age: 52 Room: [facility] ENDO ROOM 3 Gender: Female Note Status: Finalized Procedure:            Colonoscopy Indications:          Screening in patient at increased risk: Family history                        of 1st-degree relative with colorectal cancer Providers:            Wyline Mood MD, MD Referring MD:         Fernand Parkins. Clint Guy (Referring MD) Medicines:            Monitored Anesthesia Care Complications:        No immediate complications. Procedure:            Pre-Anesthesia Assessment:                       - Prior to the procedure, a History and Physical was                        performed, and patient medications, allergies and                        sensitivities were reviewed. The patient's tolerance of                        previous anesthesia was reviewed.                       - The risks and benefits of the procedure and the                        sedation options and risks were discussed with the                        patient. All questions were answered and informed                        consent was obtained.                       - ASA Grade Assessment: II - A patient with mild                        systemic disease.                       After obtaining informed consent, the colonoscope was                        passed under direct vision. Throughout the procedure,                        the patient's blood pressure, pulse, and oxygen                        saturations were monitored continuously. The  Colonoscope was introduced through the anus and                        advanced to the the cecum, identified by the                        appendiceal orifice, IC valve and transillumination.                        The colonoscopy was performed with  ease. The patient                        tolerated the procedure well. The quality of the bowel                        preparation was good. Findings:      The perianal and digital rectal examinations were normal.      The exam was otherwise without abnormality on direct and retroflexion       views. Impression:           - The examination was otherwise normal on direct and                        retroflexion views.                       - No specimens collected. Recommendation:       - Discharge patient to home (with escort).                       - Resume previous diet.                       - Continue present medications.                       - Repeat colonoscopy in 5 years for surveillance. Procedure Code(s):    --- Professional ---                       [phone], Colonoscopy, flexible; diagnostic, including                        collection of specimen(s) by brushing or washing, when                        performed (separate procedure) Diagnosis Code(s):    --- Professional ---                       Z80.0, Family history of malignant neoplasm of                        digestive organs CPT copyright 2017 American Medical Association. All rights reserved. The codes documented in this report are preliminary and upon coder review may  be revised to meet current compliance requirements. Wyline MoodKiran Neema Barreira, MD Wyline MoodKiran Jamira Barfuss MD, MD 11/15/2017 9:18:36 AM This report has been signed electronically. Number of Addenda: 0 Note Initiated On: 11/15/2017 8:39 AM Scope Withdrawal Time: 0 hours 24 minutes 21 seconds  Total Procedure Duration: 0 hours 31 minutes 5 seconds       [facility]

## 2017-11-21 ENCOUNTER — Ambulatory Visit (INDEPENDENT_AMBULATORY_CARE_PROVIDER_SITE_OTHER): Payer: BLUE CROSS/BLUE SHIELD | Admitting: Obstetrics and Gynecology

## 2017-11-21 ENCOUNTER — Encounter: Payer: Self-pay | Admitting: Obstetrics and Gynecology

## 2017-11-21 VITALS — BP 107/72 | HR 82 | Ht 61.0 in | Wt 192.8 lb

## 2017-11-21 DIAGNOSIS — Z803 Family history of malignant neoplasm of breast: Secondary | ICD-10-CM

## 2017-11-21 DIAGNOSIS — Z01411 Encounter for gynecological examination (general) (routine) with abnormal findings: Secondary | ICD-10-CM | POA: Diagnosis not present

## 2017-11-21 DIAGNOSIS — R6882 Decreased libido: Secondary | ICD-10-CM

## 2017-11-21 DIAGNOSIS — Z1371 Encounter for nonprocreative screening for genetic disease carrier status: Secondary | ICD-10-CM

## 2017-11-21 DIAGNOSIS — Z01419 Encounter for gynecological examination (general) (routine) without abnormal findings: Secondary | ICD-10-CM

## 2017-11-21 DIAGNOSIS — K589 Irritable bowel syndrome without diarrhea: Secondary | ICD-10-CM

## 2017-11-21 DIAGNOSIS — Z8742 Personal history of other diseases of the female genital tract: Secondary | ICD-10-CM

## 2017-11-21 DIAGNOSIS — Z90710 Acquired absence of both cervix and uterus: Secondary | ICD-10-CM

## 2017-11-21 DIAGNOSIS — Z90721 Acquired absence of ovaries, unilateral: Secondary | ICD-10-CM

## 2017-11-21 DIAGNOSIS — Z800 Family history of malignant neoplasm of digestive organs: Secondary | ICD-10-CM

## 2017-11-21 MED ORDER — DICYCLOMINE HCL 20 MG PO TABS: 20.0000 mg | ORAL_TABLET | Freq: Four times a day (QID) | ORAL | 1 refills | Status: DC

## 2017-11-21 NOTE — Patient Instructions (Addendum)
1.  Pap smear is not done.  No further Pap smears are needed. 2.  Mammogram is ordered. 3.  Stool guaiac card testing is not ordered. 4.  Screening labs are to be obtained through primary care 5.  Continue with healthy eating and exercise with controlled weight loss 6.  Return in 1 year for annual exam 7.  Return in 4 weeks for follow-up on IBS 8.  Trial of Bentyl 20 mg 4 times daily   Health Maintenance for Postmenopausal Women Menopause is a normal process in which your reproductive ability comes to an end. This process happens gradually over a span of months to years, usually between the ages of 78 and 66. Menopause is complete when you have missed 12 consecutive menstrual periods. It is important to talk with your health care provider about some of the most common conditions that affect postmenopausal women, such as heart disease, cancer, and bone loss (osteoporosis). Adopting a healthy lifestyle and getting preventive care can help to promote your health and wellness. Those actions can also lower your chances of developing some of these common conditions. What should I know about menopause? During menopause, you may experience a number of symptoms, such as:  Moderate-to-severe hot flashes.  Night sweats.  Decrease in sex drive.  Mood swings.  Headaches.  Tiredness.  Irritability.  Memory problems.  Insomnia.  Choosing to treat or not to treat menopausal changes is an individual decision that you make with your health care provider. What should I know about hormone replacement therapy and supplements? Hormone therapy products are effective for treating symptoms that are associated with menopause, such as hot flashes and night sweats. Hormone replacement carries certain risks, especially as you become older. If you are thinking about using estrogen or estrogen with progestin treatments, discuss the benefits and risks with your health care provider. What should I know about  heart disease and stroke? Heart disease, heart attack, and stroke become more likely as you age. This may be due, in part, to the hormonal changes that your body experiences during menopause. These can affect how your body processes dietary fats, triglycerides, and cholesterol. Heart attack and stroke are both medical emergencies. There are many things that you can do to help prevent heart disease and stroke:  Have your blood pressure checked at least every 1-2 years. High blood pressure causes heart disease and increases the risk of stroke.  If you are 3-19 years old, ask your health care provider if you should take aspirin to prevent a heart attack or a stroke.  Do not use any tobacco products, including cigarettes, chewing tobacco, or electronic cigarettes. If you need help quitting, ask your health care provider.  It is important to eat a healthy diet and maintain a healthy weight. ? Be sure to include plenty of vegetables, fruits, low-fat dairy products, and lean protein. ? Avoid eating foods that are high in solid fats, added sugars, or salt (sodium).  Get regular exercise. This is one of the most important things that you can do for your health. ? Try to exercise for at least 150 minutes each week. The type of exercise that you do should increase your heart rate and make you sweat. This is known as moderate-intensity exercise. ? Try to do strengthening exercises at least twice each week. Do these in addition to the moderate-intensity exercise.  Know your numbers.Ask your health care provider to check your cholesterol and your blood glucose. Continue to have your blood tested  as directed by your health care provider.  What should I know about cancer screening? There are several types of cancer. Take the following steps to reduce your risk and to catch any cancer development as early as possible. Breast Cancer  Practice breast self-awareness. ? This means understanding how your  breasts normally appear and feel. ? It also means doing regular breast self-exams. Let your health care provider know about any changes, no matter how small.  If you are 43 or older, have a clinician do a breast exam (clinical breast exam or CBE) every year. Depending on your age, family history, and medical history, it may be recommended that you also have a yearly breast X-ray (mammogram).  If you have a family history of breast cancer, talk with your health care provider about genetic screening.  If you are at high risk for breast cancer, talk with your health care provider about having an MRI and a mammogram every year.  Breast cancer (BRCA) gene test is recommended for women who have family members with BRCA-related cancers. Results of the assessment will determine the need for genetic counseling and BRCA1 and for BRCA2 testing. BRCA-related cancers include these types: ? Breast. This occurs in males or females. ? Ovarian. ? Tubal. This may also be called fallopian tube cancer. ? Cancer of the abdominal or pelvic lining (peritoneal cancer). ? Prostate. ? Pancreatic.  Cervical, Uterine, and Ovarian Cancer Your health care provider may recommend that you be screened regularly for cancer of the pelvic organs. These include your ovaries, uterus, and vagina. This screening involves a pelvic exam, which includes checking for microscopic changes to the surface of your cervix (Pap test).  For women ages 21-65, health care providers may recommend a pelvic exam and a Pap test every three years. For women ages 93-65, they may recommend the Pap test and pelvic exam, combined with testing for human papilloma virus (HPV), every five years. Some types of HPV increase your risk of cervical cancer. Testing for HPV may also be done on women of any age who have unclear Pap test results.  Other health care providers may not recommend any screening for nonpregnant women who are considered low risk for pelvic  cancer and have no symptoms. Ask your health care provider if a screening pelvic exam is right for you.  If you have had past treatment for cervical cancer or a condition that could lead to cancer, you need Pap tests and screening for cancer for at least 20 years after your treatment. If Pap tests have been discontinued for you, your risk factors (such as having a new sexual partner) need to be reassessed to determine if you should start having screenings again. Some women have medical problems that increase the chance of getting cervical cancer. In these cases, your health care provider may recommend that you have screening and Pap tests more often.  If you have a family history of uterine cancer or ovarian cancer, talk with your health care provider about genetic screening.  If you have vaginal bleeding after reaching menopause, tell your health care provider.  There are currently no reliable tests available to screen for ovarian cancer.  Lung Cancer Lung cancer screening is recommended for adults 26-82 years old who are at high risk for lung cancer because of a history of smoking. A yearly low-dose CT scan of the lungs is recommended if you:  Currently smoke.  Have a history of at least 30 pack-years of smoking and you  currently smoke or have quit within the past 15 years. A pack-year is smoking an average of one pack of cigarettes per day for one year.  Yearly screening should:  Continue until it has been 15 years since you quit.  Stop if you develop a health problem that would prevent you from having lung cancer treatment.  Colorectal Cancer  This type of cancer can be detected and can often be prevented.  Routine colorectal cancer screening usually begins at age 65 and continues through age 61.  If you have risk factors for colon cancer, your health care provider may recommend that you be screened at an earlier age.  If you have a family history of colorectal cancer, talk with  your health care provider about genetic screening.  Your health care provider may also recommend using home test kits to check for hidden blood in your stool.  A small camera at the end of a tube can be used to examine your colon directly (sigmoidoscopy or colonoscopy). This is done to check for the earliest forms of colorectal cancer.  Direct examination of the colon should be repeated every 5-10 years until age 52. However, if early forms of precancerous polyps or small growths are found or if you have a family history or genetic risk for colorectal cancer, you may need to be screened more often.  Skin Cancer  Check your skin from head to toe regularly.  Monitor any moles. Be sure to tell your health care provider: ? About any new moles or changes in moles, especially if there is a change in a mole's shape or color. ? If you have a mole that is larger than the size of a pencil eraser.  If any of your family members has a history of skin cancer, especially at a young age, talk with your health care provider about genetic screening.  Always use sunscreen. Apply sunscreen liberally and repeatedly throughout the day.  Whenever you are outside, protect yourself by wearing long sleeves, pants, a wide-brimmed hat, and sunglasses.  What should I know about osteoporosis? Osteoporosis is a condition in which bone destruction happens more quickly than new bone creation. After menopause, you may be at an increased risk for osteoporosis. To help prevent osteoporosis or the bone fractures that can happen because of osteoporosis, the following is recommended:  If you are 4-34 years old, get at least 1,000 mg of calcium and at least 600 mg of vitamin D per day.  If you are older than age 86 but younger than age 63, get at least 1,200 mg of calcium and at least 600 mg of vitamin D per day.  If you are older than age 34, get at least 1,200 mg of calcium and at least 800 mg of vitamin D per  day.  Smoking and excessive alcohol intake increase the risk of osteoporosis. Eat foods that are rich in calcium and vitamin D, and do weight-bearing exercises several times each week as directed by your health care provider. What should I know about how menopause affects my mental health? Depression may occur at any age, but it is more common as you become older. Common symptoms of depression include:  Low or sad mood.  Changes in sleep patterns.  Changes in appetite or eating patterns.  Feeling an overall lack of motivation or enjoyment of activities that you previously enjoyed.  Frequent crying spells.  Talk with your health care provider if you think that you are experiencing depression. What  should I know about immunizations? It is important that you get and maintain your immunizations. These include:  Tetanus, diphtheria, and pertussis (Tdap) booster vaccine.  Influenza every year before the flu season begins.  Pneumonia vaccine.  Shingles vaccine.  Your health care provider may also recommend other immunizations. This information is not intended to replace advice given to you by your health care provider. Make sure you discuss any questions you have with your health care provider. Document Released: 05/19/2005 Document Revised: 10/15/2015 Document Reviewed: 12/29/2014 Elsevier Interactive Patient Education  2018 [employer].

## 2017-11-21 NOTE — Progress Notes (Signed)
ANNUAL PREVENTATIVE CARE GYN  ENCOUNTER NOTE  Subjective:       Adriana Moore is a 52 y.o. [phone] female here for a routine annual gynecologic exam.  Current complaints: 1. Pain left upper abd-symptoms have persisted since onset of hiatal hernia surgery 2 years ago.  Patient has spasmodic discomfort in the left upper quadrant usually initiated by activities such as bending, lifting, flexing legs at hips etc.  Pain can last anywhere from seconds to 5 minutes.  No bowel dysfunction with regards to reflux, nausea, vomiting, intolerance to foods.  Patient is status post cholecystectomy.  Patient does have irritable bowel type symptoms with intermittent constipation especially initiated with traveling and foreign locations of restrooms.  Recent colonoscopy was normal 2017/11/20.  Father just passed away from colon cancer.  Patient is unable to have sex because of the spasm that occurs with hip flexion during intercourse.  No vasomotor symptoms. Bladder function normal.   Gynecologic History No LMP recorded. Patient has had a hysterectomy. Contraception: status post hysterectomy Last mammogram: 2016 birad 1 . Results were: normal No LMP recorded. Patient has had a hysterectomy. Contraception: status post hysterectomy Last Pap: N/A Menarche: 52 yo Cycles were irregular, with menorrhagia, severe pain uncontrolled with NSAIDs, dx with endometriosis S/p hysterectomy with LSO.  BRCA negative.  Obstetric History OB History  Gravida Para Term Preterm AB Living  '6 3 2 1 3 2  ' SAB TAB Ectopic Multiple Live Births  3       2    # Outcome Date GA Lbr Len/2nd Weight Sex Delivery Anes PTL Lv  6 SAB 1992          5 SAB 1990          4 Term 1989   7 lb (3.175 kg) M Vag-Spont   LIV  3 SAB 1987          2 Term 1986   6 lb (2.722 kg) F Vag-Spont   LIV  1 Preterm 1985     Vag-Spont   FD    Past Medical History:  Diagnosis Date  . Allergy   . Anxiety and depression   . Asthma   . Endometriosis   . GERD  (gastroesophageal reflux disease)   . Hiatal hernia   . Plantar fasciitis     Past Surgical History:  Procedure Laterality Date  . ABDOMINAL HYSTERECTOMY LSO     endometriosis  . BREAST BIOPSY Right 2004   EXCISIONAL - NEG  . BREAST SURGERY     breast biopsy  . CHOLECYSTECTOMY    . COLONOSCOPY WITH PROPOFOL N/A 11/15/2017   Procedure: COLONOSCOPY WITH PROPOFOL;  Surgeon: Jonathon Bellows, MD;  Location: [facility];  Service: Gastroenterology;  Laterality: N/A;  . DILATION AND CURETTAGE OF UTERUS     x 2  . FOOT SURGERY    . HERNIA REPAIR  2014   hital hernia repain   . KNEE SURGERY      Current Outpatient Medications on File Prior to Visit  Medication Sig Dispense Refill  . albuterol (PROAIR HFA) 108 (90 Base) MCG/ACT inhaler ProAir HFA 90 mcg/actuation aerosol inhaler    . EPINEPHrine 0.3 mg/0.3 mL IJ SOAJ injection Inject 0.3 mLs (0.3 mg total) into the muscle once. 1 Device 11   No current facility-administered medications on file prior to visit.     Allergies  Allergen Reactions  . Butrans [Buprenorphine]   . Cataflam [Diclofenac] Nausea And Vomiting  . Codeine   .  Demerol [Meperidine]   . Fentanyl   . Latex   . Morphine And Related   . Percocet [Oxycodone-Acetaminophen]   . Tramadol   . Vicodin [Hydrocodone-Acetaminophen]   . Opana [Oxymorphone Hcl] Nausea And Vomiting and Rash  . Oxycontin [Oxycodone] Nausea And Vomiting and Rash    Social History   Socioeconomic History  . Marital status: Married    Spouse name: Not on file  . Number of children: Not on file  . Years of education: Not on file  . Highest education level: Not on file  Occupational History  . Not on file  Social Needs  . Financial resource strain: Not on file  . Food insecurity:    Worry: Not on file    Inability: Not on file  . Transportation needs:    Medical: Not on file    Non-medical: Not on file  Tobacco Use  . Smoking status: Former Smoker    Packs/day: 0.50    Years: 15.00     Pack years: 7.50    Last attempt to quit: 04/10/2010    Years since quitting: 7.6  . Smokeless tobacco: Never Used  Substance and Sexual Activity  . Alcohol use: Yes    Comment: rare use  . Drug use: No  . Sexual activity: Yes    Birth control/protection: Surgical    Comment: hysterectomy  Lifestyle  . Physical activity:    Days per week: Not on file    Minutes per session: Not on file  . Stress: Not on file  Relationships  . Social connections:    Talks on phone: Not on file    Gets together: Not on file    Attends religious service: Not on file    Active member of club or organization: Not on file    Attends meetings of clubs or organizations: Not on file    Relationship status: Not on file  . Intimate partner violence:    Fear of current or ex partner: Not on file    Emotionally abused: Not on file    Physically abused: Not on file    Forced sexual activity: Not on file  Other Topics Concern  . Not on file  Social History Narrative  . Not on file    Family History  Problem Relation Age of Onset  . Heart attack Mother   . Cancer Mother 74       Breast Cancer  . Diabetes Mother   . Breast cancer Mother        70'S  . Heart disease Father   . Hyperlipidemia Sister   . Cancer Brother        renal cell cancer  . Heart disease Brother   . Cancer Maternal Grandmother        Breast cancer  . Heart disease Maternal Grandfather        CHF  . Heart disease Paternal Grandfather     The following portions of the patient's history were reviewed and updated as appropriate: allergies, current medications, past family history, past medical history, past social history, past surgical history and problem list.  Review of Systems Review of Systems  HENT: Negative.   Eyes: Negative.   Respiratory: Negative.   Cardiovascular: Negative.   Gastrointestinal: Positive for abdominal pain.       Left upper quadrant, spasmodic, see HPI  Genitourinary: Negative.    Musculoskeletal:       See HPI  Skin: Negative.   Neurological: Negative.  Endo/Heme/Allergies: Negative.   Psychiatric/Behavioral:       Anxiety     Objective:   BP 107/72   Pulse 82   Ht '5\' 1"'  (1.549 m)   Wt 192 lb 12.8 oz (87.5 kg)   BMI 36.43 kg/m  CONSTITUTIONAL: Well-developed, well-nourished female in no acute distress.  PSYCHIATRIC: Normal mood and affect. Normal behavior. Normal judgment and thought content. [place]: Alert and oriented to person, place, and time. Normal muscle tone coordination. No cranial nerve deficit noted. HENT:  Normocephalic, atraumatic, External right and left ear normal.  EYES: Conjunctivae and EOM are normal. No scleral icterus.  NECK: Normal range of motion, supple, no masses.  Normal thyroid.  SKIN: Skin is warm and dry. No rash noted. Not diaphoretic. No erythema. No pallor. CARDIOVASCULAR: Normal heart rate noted, regular rhythm, no murmur. RESPIRATORY: Clear to auscultation bilaterally. Effort and breath sounds normal, no problems with respiration noted. BREASTS: Symmetric in size. No masses, skin changes, nipple drainage, or lymphadenopathy. ABDOMEN: Soft, normal bowel sounds, no distention noted.  No tenderness, rebound or guarding.  No palpable spleen.  No hernia identified.  Hiatal hernia surgical port site without evidence of defect.  No palpable upper abdominal masses BLADDER: Normal PELVIC:  External Genitalia: Normal  BUS: Normal  Vagina: Normal estrogen effect  Cervix: Surgically absent  Uterus: Surgically absent  Adnexa: Normal; nonpalpable nontender  RV: Internal exam deferred due to recent normal colonoscopy and External Exam NormaI  MUSCULOSKELETAL: Normal range of motion. No tenderness.  No cyanosis, clubbing, or edema.  2+ distal pulses. LYMPHATIC: No Axillary, Supraclavicular, or Inguinal Adenopathy.    Assessment:   Annual gynecologic examination 52 y.o. Contraception: status post  hysterectomy [id number] Abdominal pain, left upper quadrant, nonacute IBS  Plan:  Pap: Not needed Mammogram: Ordered Stool Guaiac Testing:  colonoscopy- utd- wnl 11/15/2017 Labs: "pcp Routine preventative health maintenance measures emphasized: Exercise/Diet/Weight control, Tobacco Warnings, Alcohol/Substance use risks and Stress Management Trial of Bentyl 20 mg 4 times daily Return in 4 weeks for follow-up on abdominal pain Return to [place], CMA  Brayton Mars, MD  Note: This dictation was prepared with Dragon dictation along with smaller phrase technology. Any transcriptional errors that result from this process are unintentional.

## 2017-12-18 ENCOUNTER — Ambulatory Visit (INDEPENDENT_AMBULATORY_CARE_PROVIDER_SITE_OTHER): Payer: BLUE CROSS/BLUE SHIELD | Admitting: Obstetrics and Gynecology

## 2017-12-18 ENCOUNTER — Encounter: Payer: Self-pay | Admitting: Obstetrics and Gynecology

## 2017-12-18 VITALS — BP 136/72 | HR 84 | Ht 61.0 in | Wt 186.4 lb

## 2017-12-18 DIAGNOSIS — K5909 Other constipation: Secondary | ICD-10-CM | POA: Diagnosis not present

## 2017-12-18 DIAGNOSIS — K581 Irritable bowel syndrome with constipation: Secondary | ICD-10-CM | POA: Diagnosis not present

## 2017-12-18 DIAGNOSIS — Z79899 Other long term (current) drug therapy: Secondary | ICD-10-CM

## 2017-12-18 NOTE — Patient Instructions (Signed)
1.  Continue taking Bentyl 20 mg twice a day for colon spasm 2.  Return in 6 months for follow-up.

## 2017-12-18 NOTE — Progress Notes (Signed)
Chief complaint: 1.  Abdominal pelvic pain, chronic, left upper quadrant 2.  IBS  Patient presents for 15-minute conference for follow-up on left upper quadrant spasmodic intermittent abdominal pain.  She does have diagnosis of IBS.  She was started on Bentyl 20 mg 4 times a day.  Since taking medication, she has had market improvement in her left upper quadrant spasmodic pain.  She is not having recurrent episodes as previously noted; medication has not affected her bowel movement frequency, and she does have her episodes of chronic constipation periodically.  Since going on the medication she has had intercourse and did not notice the spasmodic pain that previously was associated with intercourse.  Overall she is pleased with the way the medication has helped her symptoms; however, she has decreased dosage from 4 times a day to 2 times a day because she states that the frequent dosing of the medication caused her to feel kind of "drugged".  She states that makes her somewhat fatigued.  Taking the medication first thing in the morning and then at bedtime is effective with minimizing side effects.  OBJECTIVE: BP 136/72   Pulse 84   Ht 5\' 1"  (1.549 m)   Wt 186 lb 6.4 oz (84.6 kg)   BMI 35.22 kg/m  Physical exam-deferred  ASSESSMENT: 1.  Left upper quadrant pelvic pain, spasmodic, not acute, improved with medication 2.  IBS, stable 3.  Chronic constipation, stable 4.  Medication side effects required decreasing frequency of dosing; however, beneficial effect as noted  PLAN: 1.  Continue with Bentyl 20 mg twice daily 2.  Monitor abdominal pelvic pain symptoms 3.  Return in 6 months for follow-up  A total of 15 minutes were spent face-to-face with the patient during this encounter and over half of that time dealt with counseling and coordination of care.  Herold Harms, MD  Note: This dictation was prepared with Dragon dictation along with smaller phrase technology. Any  transcriptional errors that result from this process are unintentional.

## 2017-12-19 ENCOUNTER — Encounter: Payer: BLUE CROSS/BLUE SHIELD | Admitting: Obstetrics and Gynecology

## 2017-12-21 ENCOUNTER — Other Ambulatory Visit: Payer: Self-pay | Admitting: Obstetrics and Gynecology

## 2018-03-18 ENCOUNTER — Other Ambulatory Visit: Payer: Self-pay | Admitting: Obstetrics and Gynecology

## 2018-03-18 DIAGNOSIS — Z1231 Encounter for screening mammogram for malignant neoplasm of breast: Secondary | ICD-10-CM

## 2018-03-26 ENCOUNTER — Other Ambulatory Visit: Payer: Self-pay | Admitting: Obstetrics and Gynecology

## 2018-03-26 ENCOUNTER — Ambulatory Visit
Admission: RE | Admit: 2018-03-26 | Discharge: 2018-03-26 | Disposition: A | Discharge status: Home / Self Care | Payer: BLUE CROSS/BLUE SHIELD | Source: Ambulatory Visit | Attending: Obstetrics and Gynecology | Admitting: Obstetrics and Gynecology

## 2018-03-26 DIAGNOSIS — Z1231 Encounter for screening mammogram for malignant neoplasm of breast: Secondary | ICD-10-CM | POA: Insufficient documentation

## 2018-03-27 ENCOUNTER — Other Ambulatory Visit: Payer: Self-pay | Admitting: Obstetrics and Gynecology

## 2018-03-27 DIAGNOSIS — N6489 Other specified disorders of breast: Secondary | ICD-10-CM

## 2018-04-08 ENCOUNTER — Ambulatory Visit
Admission: RE | Admit: 2018-04-08 | Discharge: 2018-04-08 | Disposition: A | Discharge status: Home / Self Care | Payer: BLUE CROSS/BLUE SHIELD | Source: Ambulatory Visit | Attending: Obstetrics and Gynecology | Admitting: Obstetrics and Gynecology

## 2018-04-08 DIAGNOSIS — N6489 Other specified disorders of breast: Secondary | ICD-10-CM | POA: Diagnosis present

## 2018-07-09 ENCOUNTER — Encounter: Payer: BLUE CROSS/BLUE SHIELD | Admitting: Obstetrics and Gynecology

## 2018-10-29 ENCOUNTER — Encounter: Payer: BLUE CROSS/BLUE SHIELD | Admitting: Obstetrics and Gynecology

## 2018-11-26 ENCOUNTER — Encounter: Payer: BLUE CROSS/BLUE SHIELD | Admitting: Obstetrics and Gynecology

## 2023-03-29 ENCOUNTER — Other Ambulatory Visit: Payer: Self-pay | Admitting: Adult Health

## 2023-03-29 DIAGNOSIS — N83202 Unspecified ovarian cyst, left side: Secondary | ICD-10-CM

## 2023-03-29 DIAGNOSIS — Z87442 Personal history of urinary calculi: Secondary | ICD-10-CM

## 2023-03-29 DIAGNOSIS — R350 Frequency of micturition: Secondary | ICD-10-CM

## 2023-03-30 ENCOUNTER — Ambulatory Visit: Payer: BC Managed Care – PPO
